# Patient Record
Sex: Male | Born: 1985
Health system: Southern US, Community
[De-identification: ages and names within clinical notes are randomized; demographics above are authoritative.]

## PROBLEM LIST (undated history)

## (undated) DIAGNOSIS — Z789 Other specified health status: Secondary | ICD-10-CM

## (undated) DIAGNOSIS — H7090 Unspecified mastoiditis, unspecified ear: Secondary | ICD-10-CM

## (undated) HISTORY — PX: NO PAST SURGERIES: SHX2092

## (undated) HISTORY — PX: LEG SURGERY: SHX1003

---

## 2018-12-09 DIAGNOSIS — H7090 Unspecified mastoiditis, unspecified ear: Secondary | ICD-10-CM

## 2018-12-09 HISTORY — DX: Unspecified mastoiditis, unspecified ear: H70.90

## 2018-12-15 ENCOUNTER — Other Ambulatory Visit: Payer: Self-pay

## 2018-12-15 ENCOUNTER — Encounter (HOSPITAL_BASED_OUTPATIENT_CLINIC_OR_DEPARTMENT_OTHER): Payer: Self-pay | Admitting: *Deleted

## 2018-12-15 DIAGNOSIS — H66011 Acute suppurative otitis media with spontaneous rupture of ear drum, right ear: Secondary | ICD-10-CM | POA: Insufficient documentation

## 2018-12-15 NOTE — ED Triage Notes (Signed)
URI sx x 1 week, now having pain in right ear. Headache also

## 2018-12-16 ENCOUNTER — Emergency Department (HOSPITAL_BASED_OUTPATIENT_CLINIC_OR_DEPARTMENT_OTHER)
Admission: EM | Admit: 2018-12-16 | Discharge: 2018-12-16 | Disposition: A | Discharge status: Home / Self Care | Payer: Self-pay | Attending: Emergency Medicine | Admitting: Emergency Medicine

## 2018-12-16 DIAGNOSIS — H66011 Acute suppurative otitis media with spontaneous rupture of ear drum, right ear: Secondary | ICD-10-CM

## 2018-12-16 MED ORDER — CIPROFLOXACIN-DEXAMETHASONE 0.3-0.1 % OT SUSP
4.0000 [drp] | Freq: Two times a day (BID) | OTIC | Status: DC
  Administered 2018-12-16: 4 [drp] via OTIC

## 2018-12-16 MED ORDER — CIPROFLOXACIN-DEXAMETHASONE 0.3-0.1 % OT SUSP
OTIC | Status: AC
  Filled 2018-12-16: qty 7.5

## 2018-12-16 MED ORDER — AMOXICILLIN 500 MG PO CAPS
1000.0000 mg | ORAL_CAPSULE | Freq: Once | ORAL | Status: AC
  Administered 2018-12-16: 1000 mg via ORAL
  Filled 2018-12-16: qty 2

## 2018-12-16 MED ORDER — AMOXICILLIN 500 MG PO CAPS: 1000.0000 mg | ORAL_CAPSULE | Freq: Three times a day (TID) | ORAL | 0 refills | Status: DC

## 2018-12-16 MED ORDER — HYDROCODONE-ACETAMINOPHEN 5-325 MG PO TABS
1.0000 | ORAL_TABLET | Freq: Once | ORAL | Status: AC
  Administered 2018-12-16: 1 via ORAL
  Filled 2018-12-16: qty 1

## 2018-12-16 MED ORDER — CIPROFLOXACIN-DEXAMETHASONE 0.3-0.1 % OT SUSP: 4.0000 [drp] | Freq: Two times a day (BID) | OTIC | 0 refills | Status: DC

## 2018-12-16 NOTE — Discharge Instructions (Addendum)
As we discussed it appears you have an ear infection that ruptured your eardrum.  You should take both antibiotics by mouth as well as the antibiotic drops.  Use Tylenol or ibuprofen as needed for fever and pain.  Follow-up with ear nose and throat doctor early next week.  Return to the ED if you develop worsening pain, fever, vomiting, any other concerns.

## 2018-12-16 NOTE — ED Notes (Signed)
Pts family member still standing outside of room. Provider was notified that she has asked many of the staff about pain medication. He acknowledged

## 2018-12-16 NOTE — ED Notes (Signed)
pts family member stepped out of room. RN stopped to see if family member needed anything. She stated that patients pain is getting worse. "I have known him for 4 years and have never seen him in so much pain". Primary RN notified

## 2018-12-16 NOTE — ED Notes (Signed)
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 Scripts given at [employer]. All questions answered to satisfaction. Ice pack given at dc. Pt and family escorted to check out counter.

## 2018-12-16 NOTE — ED Notes (Signed)
Pt took 800mg  around 630pm

## 2018-12-16 NOTE — ED Provider Notes (Signed)
[facility] EMERGENCY DEPARTMENT Provider Note   CSN: [id number] Arrival date & time: 12/15/18  2351    History   Chief Complaint Chief Complaint  Patient presents with  . Otalgia    HPI Marcus Finley is a 33 y.o. male.     Patient with 6 to 8 hours of severe right ear pain.  Denies trauma.  States he uses Q-tips on a regular basis but "does not go deep".  States he has had a URI for the past 1 week with cough, congestion, runny nose and sore throat.  No fever.  No history of ear problems.  There is been no bleeding or drainage from the ear.  He has not tried to take anything for it at home.  Denies any difficulty breathing or difficulty swallowing.  No chest pain or shortness of breath.  The history is provided by the patient.  Otalgia  Associated symptoms: congestion, cough, headaches, rhinorrhea and sore throat   Associated symptoms: no abdominal pain, no ear discharge, no neck pain, no rash and no vomiting     History reviewed. No pertinent past medical history.  There are no active problems to display for this patient.   Past Surgical History:  Procedure Laterality Date  . LEG SURGERY          Home Medications    Prior to Admission medications   Not on File    Family History No family history on file.  Social History Social History   Tobacco Use  . Smoking status: Never Smoker  . Smokeless tobacco: Never Used  Substance Use Topics  . Alcohol use: Yes  . Drug use: Never     Allergies   Patient has no known allergies.   Review of Systems Review of Systems  Constitutional: Negative for activity change and appetite change.  HENT: Positive for congestion, ear pain, rhinorrhea and sore throat. Negative for ear discharge.   Respiratory: Positive for cough. Negative for chest tightness.   Cardiovascular: Negative for chest pain.  Gastrointestinal: Negative for abdominal pain, nausea and vomiting.  Genitourinary: Negative for dysuria and  hematuria.  Musculoskeletal: Negative for arthralgias, back pain, myalgias and neck pain.  Skin: Negative for rash.  Neurological: Positive for headaches. Negative for dizziness, weakness and light-headedness.   all other systems are negative except as noted in the HPI and PMH.     Physical Exam Updated Vital Signs BP 126/72 (BP Location: Left Arm)   Pulse 89   Temp 98.1 F (36.7 C) (Oral)   Resp 16   Ht 5\' 11"  (1.803 m)   Wt 68 kg   SpO2 98%   BMI 20.92 kg/m   Physical Exam Vitals signs and nursing note reviewed.  Constitutional:      General: He is not in acute distress.    Appearance: He is well-developed.     Comments: Appears uncomfortable  HENT:     Head: Normocephalic and atraumatic.     Right Ear: Drainage present. No mastoid tenderness. Tympanic membrane is perforated, erythematous and bulging. Tympanic membrane has decreased mobility.     Left Ear: Tympanic membrane and ear canal normal.     Ears:     Comments: No tragus or mastoid pain bilaterally.  TM on left appears normal. Landmarks of TM on right cannot be visualized.  There is purulent drainage in the ear canal.  Suspect TM perforation.    Mouth/Throat:     Pharynx: No oropharyngeal exudate.  Eyes:  Conjunctiva/sclera: Conjunctivae normal.     Pupils: Pupils are equal, round, and reactive to light.  Neck:     Musculoskeletal: Normal range of motion and neck supple.     Comments: No meningismus. Cardiovascular:     Rate and Rhythm: Normal rate and regular rhythm.     Heart sounds: Normal heart sounds. No murmur.  Pulmonary:     Effort: Pulmonary effort is normal. No respiratory distress.     Breath sounds: Normal breath sounds.  Abdominal:     Palpations: Abdomen is soft.     Tenderness: There is no abdominal tenderness. There is no guarding or rebound.  Musculoskeletal: Normal range of motion.        General: No tenderness.  Skin:    General: Skin is warm.  Neurological:     Mental Status: He  is alert and oriented to person, place, and time.     Cranial Nerves: No cranial nerve deficit.     Motor: No abnormal muscle tone.     Coordination: Coordination normal.     Comments: No ataxia on finger to nose bilaterally. No pronator drift. 5/5 strength throughout. CN 2-12 intact.Equal grip strength. Sensation intact.   Psychiatric:        Behavior: Behavior normal.      ED Treatments / Results  Labs (all labs ordered are listed, but only abnormal results are displayed) Labs Reviewed - No data to display  EKG None  Radiology No results found.  Procedures Procedures (including critical care time)  Medications Ordered in ED Medications  HYDROcodone-acetaminophen (NORCO/VICODIN) 5-325 MG per tablet 1 tablet (has no administration in time range)  amoxicillin (AMOXIL) capsule 1,000 mg (has no administration in time range)     Initial Impression / Assessment and Plan / ED Course  I have reviewed the triage vital signs and the nursing notes.  Pertinent labs & imaging results that were available during my care of the patient were reviewed by me and considered in my medical decision making (see chart for details).       Patient with suspected purulent otitis media with TM perforation.  He is afebrile nontoxic-appearing.  No tragus or mastoid pain.  Will treat with PO as well as topical antibiotics. Pain control.  ENT followup early next week.  Return precautions discussed.   Final Clinical Impressions(s) / ED Diagnoses   Final diagnoses:  Acute suppurative otitis media of right ear with spontaneous rupture of tympanic membrane, recurrence not specified    ED Discharge Orders         Ordered    amoxicillin (AMOXIL) 500 MG capsule  3 times daily     12/16/18 0345    ciprofloxacin-dexamethasone (CIPRODEX) OTIC suspension  2 times daily     12/16/18 0345           Gabbie Marzo, Jeannett Senior, MD 12/16/18 [phone]

## 2018-12-22 ENCOUNTER — Inpatient Hospital Stay (HOSPITAL_BASED_OUTPATIENT_CLINIC_OR_DEPARTMENT_OTHER)
Admission: EM | Admit: 2018-12-22 | Discharge: 2018-12-25 | DRG: 153 | Disposition: A | Discharge status: Home / Self Care | Payer: Self-pay | Attending: Internal Medicine | Admitting: Internal Medicine

## 2018-12-22 ENCOUNTER — Emergency Department (HOSPITAL_BASED_OUTPATIENT_CLINIC_OR_DEPARTMENT_OTHER): Payer: Self-pay

## 2018-12-22 ENCOUNTER — Other Ambulatory Visit: Payer: Self-pay

## 2018-12-22 ENCOUNTER — Encounter (HOSPITAL_BASED_OUTPATIENT_CLINIC_OR_DEPARTMENT_OTHER): Payer: Self-pay | Admitting: Adult Health

## 2018-12-22 DIAGNOSIS — G510 Bell's palsy: Secondary | ICD-10-CM | POA: Diagnosis present

## 2018-12-22 DIAGNOSIS — H9211 Otorrhea, right ear: Secondary | ICD-10-CM | POA: Diagnosis present

## 2018-12-22 DIAGNOSIS — H66011 Acute suppurative otitis media with spontaneous rupture of ear drum, right ear: Principal | ICD-10-CM | POA: Diagnosis present

## 2018-12-22 DIAGNOSIS — H7091 Unspecified mastoiditis, right ear: Secondary | ICD-10-CM | POA: Diagnosis present

## 2018-12-22 DIAGNOSIS — H7090 Unspecified mastoiditis, unspecified ear: Secondary | ICD-10-CM | POA: Diagnosis present

## 2018-12-22 HISTORY — DX: Unspecified mastoiditis, unspecified ear: H70.90

## 2018-12-22 HISTORY — DX: Other specified health status: Z78.9

## 2018-12-22 LAB — CBC
HCT: 46.8 % (ref 39.0–52.0)
Hemoglobin: 16.7 g/dL (ref 13.0–17.0)
MCH: 33 pg (ref 26.0–34.0)
MCHC: 35.7 g/dL (ref 30.0–36.0)
MCV: 92.5 fL (ref 80.0–100.0)
Platelets: 322 10*3/uL (ref 150–400)
RBC: 5.06 MIL/uL (ref 4.22–5.81)
RDW: 11.6 % (ref 11.5–15.5)
WBC: 10 10*3/uL (ref 4.0–10.5)
nRBC: 0 % (ref 0.0–0.2)

## 2018-12-22 LAB — CBC WITH DIFFERENTIAL/PLATELET
Abs Immature Granulocytes: 0.05 10*3/uL (ref 0.00–0.07)
Basophils Absolute: 0.1 10*3/uL (ref 0.0–0.1)
Basophils Relative: 0 %
Eosinophils Absolute: 0.1 10*3/uL (ref 0.0–0.5)
Eosinophils Relative: 1 %
HCT: 49.8 % (ref 39.0–52.0)
Hemoglobin: 17.2 g/dL — ABNORMAL HIGH (ref 13.0–17.0)
Immature Granulocytes: 0 %
Lymphocytes Relative: 20 %
Lymphs Abs: 2.3 10*3/uL (ref 0.7–4.0)
MCH: 32.3 pg (ref 26.0–34.0)
MCHC: 34.5 g/dL (ref 30.0–36.0)
MCV: 93.4 fL (ref 80.0–100.0)
Monocytes Absolute: 0.7 10*3/uL (ref 0.1–1.0)
Monocytes Relative: 6 %
Neutro Abs: 8.1 10*3/uL — ABNORMAL HIGH (ref 1.7–7.7)
Neutrophils Relative %: 73 %
Platelets: 306 10*3/uL (ref 150–400)
RBC: 5.33 MIL/uL (ref 4.22–5.81)
RDW: 11.6 % (ref 11.5–15.5)
WBC: 11.2 10*3/uL — ABNORMAL HIGH (ref 4.0–10.5)
nRBC: 0 % (ref 0.0–0.2)

## 2018-12-22 LAB — CREATININE, SERUM
Creatinine, Ser: 0.92 mg/dL (ref 0.61–1.24)
GFR calc Af Amer: >60 mL/min (ref >60)
GFR calc non Af Amer: >60 mL/min (ref >60)

## 2018-12-22 LAB — BASIC METABOLIC PANEL
Anion gap: 7 (ref 5–15)
BUN: 13 mg/dL (ref 6–20)
CO2: 29 mmol/L (ref 22–32)
Calcium: 9.7 mg/dL (ref 8.9–10.3)
Chloride: 102 mmol/L (ref 98–111)
Creatinine, Ser: 1.02 mg/dL (ref 0.61–1.24)
GFR calc Af Amer: >60 mL/min (ref >60)
GFR calc non Af Amer: >60 mL/min (ref >60)
Glucose, Bld: 113 mg/dL — ABNORMAL HIGH (ref 70–99)
Potassium: 4.3 mmol/L (ref 3.5–5.1)
Sodium: 138 mmol/L (ref 135–145)

## 2018-12-22 MED ORDER — POTASSIUM CHLORIDE IN NACL 20-0.9 MEQ/L-% IV SOLN
INTRAVENOUS | Status: DC
  Administered 2018-12-22 – 2018-12-23 (×2): via INTRAVENOUS
  Filled 2018-12-22 (×2): qty 1000

## 2018-12-22 MED ORDER — POLYVINYL ALCOHOL 1.4 % OP SOLN
1.0000 [drp] | OPHTHALMIC | Status: DC
  Administered 2018-12-22 – 2018-12-25 (×34): 1 [drp] via OPHTHALMIC
  Filled 2018-12-22 (×2): qty 15

## 2018-12-22 MED ORDER — HYDROCODONE-ACETAMINOPHEN 5-325 MG PO TABS
1.0000 | ORAL_TABLET | Freq: Once | ORAL | Status: DC
  Filled 2018-12-22: qty 1

## 2018-12-22 MED ORDER — SODIUM CHLORIDE 0.9% FLUSH
3.0000 mL | INTRAVENOUS | Status: DC | PRN
  Administered 2018-12-23: 3 mL via INTRAVENOUS
  Filled 2018-12-22: qty 3

## 2018-12-22 MED ORDER — ACETAMINOPHEN 325 MG PO TABS: 650.0000 mg | ORAL_TABLET | Freq: Four times a day (QID) | ORAL | Status: DC | PRN

## 2018-12-22 MED ORDER — ARTIFICIAL TEARS OPHTHALMIC OINT
TOPICAL_OINTMENT | Freq: Every day | OPHTHALMIC | Status: DC
  Administered 2018-12-22 – 2018-12-24 (×3): via OPHTHALMIC
  Filled 2018-12-22: qty 3.5

## 2018-12-22 MED ORDER — ONDANSETRON HCL 4 MG/2ML IJ SOLN: 4.0000 mg | Freq: Four times a day (QID) | INTRAMUSCULAR | Status: DC | PRN

## 2018-12-22 MED ORDER — SODIUM CHLORIDE 0.9 % IV SOLN
INTRAVENOUS | Status: DC | PRN
  Administered 2018-12-22: 250 mL via INTRAVENOUS

## 2018-12-22 MED ORDER — HYDROCODONE-ACETAMINOPHEN 5-325 MG PO TABS
2.0000 | ORAL_TABLET | Freq: Once | ORAL | Status: AC
  Administered 2018-12-22: 2 via ORAL
  Filled 2018-12-22: qty 2

## 2018-12-22 MED ORDER — ONDANSETRON HCL 4 MG PO TABS: 4.0000 mg | ORAL_TABLET | Freq: Four times a day (QID) | ORAL | Status: DC | PRN

## 2018-12-22 MED ORDER — KETOROLAC TROMETHAMINE 30 MG/ML IJ SOLN
30.0000 mg | Freq: Four times a day (QID) | INTRAMUSCULAR | Status: DC | PRN
  Administered 2018-12-22 – 2018-12-23 (×2): 30 mg via INTRAVENOUS
  Filled 2018-12-22 (×2): qty 1

## 2018-12-22 MED ORDER — ENOXAPARIN SODIUM 40 MG/0.4ML ~~LOC~~ SOLN
40.0000 mg | SUBCUTANEOUS | Status: DC
  Administered 2018-12-22: 40 mg via SUBCUTANEOUS
  Filled 2018-12-22 (×3): qty 0.4

## 2018-12-22 MED ORDER — VANCOMYCIN HCL IN DEXTROSE 750-5 MG/150ML-% IV SOLN
750.0000 mg | Freq: Three times a day (TID) | INTRAVENOUS | Status: DC
  Administered 2018-12-22 – 2018-12-25 (×9): 750 mg via INTRAVENOUS
  Filled 2018-12-22 (×11): qty 150

## 2018-12-22 MED ORDER — VANCOMYCIN HCL 500 MG IV SOLR
INTRAVENOUS | Status: AC
  Filled 2018-12-22: qty 500

## 2018-12-22 MED ORDER — SODIUM CHLORIDE 0.9 % IV SOLN
1.0000 g | Freq: Once | INTRAVENOUS | Status: AC
  Administered 2018-12-22: 1 g via INTRAVENOUS
  Filled 2018-12-22: qty 10

## 2018-12-22 MED ORDER — ACETAMINOPHEN 650 MG RE SUPP: 650.0000 mg | Freq: Four times a day (QID) | RECTAL | Status: DC | PRN

## 2018-12-22 MED ORDER — SODIUM CHLORIDE 0.9 % IV SOLN
1.0000 g | INTRAVENOUS | Status: DC
  Administered 2018-12-23 – 2018-12-25 (×3): 1 g via INTRAVENOUS
  Filled 2018-12-22 (×3): qty 10

## 2018-12-22 MED ORDER — SODIUM CHLORIDE 0.9% FLUSH
3.0000 mL | Freq: Two times a day (BID) | INTRAVENOUS | Status: DC
  Administered 2018-12-23 – 2018-12-24 (×2): 3 mL via INTRAVENOUS

## 2018-12-22 MED ORDER — IOHEXOL 300 MG/ML  SOLN
100.0000 mL | Freq: Once | INTRAMUSCULAR | Status: AC | PRN
  Administered 2018-12-22: 80 mL via INTRAVENOUS

## 2018-12-22 MED ORDER — SODIUM CHLORIDE 0.9 % IV BOLUS
1000.0000 mL | Freq: Once | INTRAVENOUS | Status: AC
  Administered 2018-12-22: 1000 mL via INTRAVENOUS

## 2018-12-22 MED ORDER — OXYCODONE HCL 5 MG PO TABS
5.0000 mg | ORAL_TABLET | ORAL | Status: DC | PRN
  Administered 2018-12-22: 5 mg via ORAL
  Filled 2018-12-22 (×2): qty 1

## 2018-12-22 MED ORDER — SODIUM CHLORIDE 0.9 % IV SOLN: 250.0000 mL | INTRAVENOUS | Status: DC | PRN

## 2018-12-22 MED ORDER — CIPROFLOXACIN-DEXAMETHASONE 0.3-0.1 % OT SUSP
4.0000 [drp] | Freq: Two times a day (BID) | OTIC | Status: DC
  Administered 2018-12-22 – 2018-12-25 (×6): 4 [drp] via OTIC
  Filled 2018-12-22 (×2): qty 7.5

## 2018-12-22 MED ORDER — VANCOMYCIN HCL 500 MG IV SOLR
INTRAVENOUS | Status: AC
  Filled 2018-12-22: qty 1000

## 2018-12-22 MED ORDER — ZOLPIDEM TARTRATE 5 MG PO TABS: 5.0000 mg | ORAL_TABLET | Freq: Every evening | ORAL | Status: DC | PRN

## 2018-12-22 MED ORDER — VANCOMYCIN HCL 10 G IV SOLR
1500.0000 mg | Freq: Once | INTRAVENOUS | Status: AC
  Filled 2018-12-22: qty 1500

## 2018-12-22 NOTE — Consult Note (Signed)
Reason for Consult: Mastoiditis Referring Physician: Hospitalist  Marcus Finley is an 33 y.o. male.  HPI: 33 year old male developed URI symptoms about 10 days ago that later improved.  One week ago, his right jaw and then right ear began hurting.  The ear started draining.  He was seen in the ER and prescribed amoxicillin, Ciprodex drops, and pain medicine.  He was seen in our office three days later and was told to continue medicines and azithromycin was added for possible bullous myringitis.  He called back three days later with continued pain and needed more pain medicine which was given.  He was told to get a temporal bone CT that was done today through the ER.  He was transferred to [facility] for admission.  He has noticed right-sided facial weakness for the past two days.  Pain and ear drainage continue.  Past Medical History:  Diagnosis Date  . Medical history non-contributory     Past Surgical History:  Procedure Laterality Date  . LEG SURGERY    . NO PAST SURGERIES      History reviewed. No pertinent family history.  Social History:  reports that he has never smoked. He has never used smokeless tobacco. He reports current alcohol use. He reports that he does not use drugs.  Allergies: No Known Allergies  Medications: I have reviewed the patient's current medications.  Results for orders placed or performed during the hospital encounter of 12/22/18 (from the past 48 hour(s))  CBC with Differential     Status: Abnormal   Collection Time: 12/22/18 12:54 PM  Result Value Ref Range   WBC 11.2 (H) 4.0 - 10.5 K/uL   RBC 5.33 4.22 - 5.81 MIL/uL   Hemoglobin 17.2 (H) 13.0 - 17.0 g/dL   HCT 44.0 34.7 - 42.5 %   MCV 93.4 80.0 - 100.0 fL   MCH 32.3 26.0 - 34.0 pg   MCHC 34.5 30.0 - 36.0 g/dL   RDW 95.6 38.7 - 56.4 %   Platelets 306 150 - 400 K/uL   nRBC 0.0 0.0 - 0.2 %   Neutrophils Relative % 73 %   Neutro Abs 8.1 (H) 1.7 - 7.7 K/uL   Lymphocytes Relative 20 %   Lymphs Abs 2.3 0.7  - 4.0 K/uL   Monocytes Relative 6 %   Monocytes Absolute 0.7 0.1 - 1.0 K/uL   Eosinophils Relative 1 %   Eosinophils Absolute 0.1 0.0 - 0.5 K/uL   Basophils Relative 0 %   Basophils Absolute 0.1 0.0 - 0.1 K/uL   Immature Granulocytes 0 %   Abs Immature Granulocytes 0.05 0.00 - 0.07 K/uL    Comment: Performed at [facility], [address] [place], [place] [address]  Basic metabolic panel     Status: Abnormal   Collection Time: 12/22/18 12:54 PM  Result Value Ref Range   Sodium 138 135 - 145 mmol/L   Potassium 4.3 3.5 - 5.1 mmol/L   Chloride 102 98 - 111 mmol/L   CO2 29 22 - 32 mmol/L   Glucose, Bld 113 (H) 70 - 99 mg/dL   BUN 13 6 - 20 mg/dL   Creatinine, Ser 1.88 0.61 - 1.24 mg/dL   Calcium 9.7 8.9 - 41.6 mg/dL   GFR calc non Af Amer >60 >60 mL/min   GFR calc Af Amer >60 >60 mL/min   Anion gap 7 5 - 15    Comment: Performed at [facility], [address] [place], [place] [address]    Ct Temporal Bones W Contrast  Result Date: 12/22/2018 CLINICAL DATA:  Right ear pain.  Right facial weakness. EXAM: CT TEMPORAL BONES WITH CONTRAST TECHNIQUE: Axial and coronal plane CT imaging of the petrous temporal bones was performed with thin-collimation image reconstruction after intravenous contrast administration. Multiplanar CT image reconstructions were also generated. CONTRAST:  16mL OMNIPAQUE IOHEXOL 300 MG/ML  SOLN COMPARISON:  None. FINDINGS: Left temporal bone: External auditory canal is widely patent. Tympanic membrane is normal. Middle ear is clear. Mastoid air cells are clear. Inner ear structures appear normal. Right temporal bone: Complete opacification of the middle ear. No ossicular chain destruction identified. Near complete opacification throughout the mastoid air cells and the attic. No evidence of bone destruction or coalescence. Inner ear structures appear normal. No evidence of adjacent soft tissue abscess or of intracranial complication. Transverse and  sigmoid sinuses show flow. Temporomandibular joints appear grossly unremarkable. IMPRESSION: Complete opacification of the middle ear on the right. Near complete opacification of the mastoid air cells and attic on the right. No evidence of bone destruction or coalescence. No evidence of adjacent soft tissue abscess or intracranial complication. Findings are consistent with otitis media and mastoiditis. Electronically Signed   By: Paulina Fusi M.D.   On: 12/22/2018 14:15    Review of Systems  HENT: Positive for ear discharge and ear pain.   Neurological: Positive for focal weakness.  All other systems reviewed and are negative.  Blood pressure 129/79, pulse (!) 56, temperature 98 F (36.7 C), temperature source Oral, resp. rate 18, height 5\' 11"  (1.803 m), weight 68 kg, SpO2 100 %. Physical Exam  Constitutional: He is oriented to person, place, and time. He appears well-developed and well-nourished. No distress.  HENT:  Head: Normocephalic and atraumatic.  Right Ear: External ear normal.  Left Ear: External ear normal.  Nose: Nose normal.  Mouth/Throat: Oropharynx is clear and moist.  Right otorrhea, able to visualize posterior perforation with associated granulation.  Eyes: Pupils are equal, round, and reactive to light. Conjunctivae and EOM are normal.  Neck: Normal range of motion. Neck supple.  Respiratory: Effort normal.  Neurological: He is alert and oriented to person, place, and time. A cranial nerve deficit (Right facial weakness, able to close eye but does not blink naturally, corner of mouth weak.) is present.  Skin: Skin is warm and dry.  Psychiatric: He has a normal mood and affect. His behavior is normal. Judgment and thought content normal.    Assessment/Plan: Right acute otomastoiditis with perforation, otorrhea, and facial weakness  I personally reviewed his temporal bone CT demonstrating opacified right middle ear and mastoid.  He has a perforation of the tympanic  membrane on exam that is allowing drainage of the infection.  Thus, a tube is not needed.  I collected a culture from the right ear.  I agreed with broad spectrum IV antibiotics and continued Ciprodex drops.  Will add right eye moisturizing care.  Will follow closely.  Anticipate a few days of inpatient management.  Christia Reading 12/22/2018, 6:00 PM

## 2018-12-22 NOTE — ED Triage Notes (Addendum)
PResents with right ear pain, he has been seen by ENT and placed on antibiotics, he was told to come here due to stiill having severe pain with hydrocodone ans the antibiotic. He endorses pain with opening his mouth as well.  -taking AMox  He appears to have a slight facial droop to right side and complains of numbness in the right side, he has a difficult time closing his right eye as well.

## 2018-12-22 NOTE — ED Provider Notes (Signed)
[facility] EMERGENCY DEPARTMENT Provider Note   CSN: [id number] Arrival date & time: 12/22/18  1117    History   Chief Complaint Chief Complaint  Patient presents with  . Otalgia    HPI Marcus Finley is a 33 y.o. male who presents with a 10-day history of right ear pain and drainage.  Patient was seen on 12/16/2018 in the ED and started on amoxicillin and Ciprodex.  He was sent for follow-up to the ENT and switch to azithromycin.  He has had continued pain and now having weakness of the right side of his face and pain when he opens his jaw below his ear.  He denies any fevers.  He spoke with the ENT who advised him to come here for CT scan.     HPI  History reviewed. No pertinent past medical history.  Patient Active Problem List   Diagnosis Date Noted  . Mastoiditis 12/22/2018    Past Surgical History:  Procedure Laterality Date  . LEG SURGERY          Home Medications    Prior to Admission medications   Medication Sig Start Date End Date Taking? Authorizing Provider  amoxicillin (AMOXIL) 500 MG capsule Take 2 capsules (1,000 mg total) by mouth 3 (three) times daily. 12/16/18   Rancour, Jeannett Senior, MD  ciprofloxacin-dexamethasone (CIPRODEX) OTIC suspension Place 4 drops into the right ear 2 (two) times daily for 7 days. 12/16/18 12/23/18  Glynn Octave, MD    Family History History reviewed. No pertinent family history.  Social History Social History   Tobacco Use  . Smoking status: Never Smoker  . Smokeless tobacco: Never Used  Substance Use Topics  . Alcohol use: Yes  . Drug use: Never     Allergies   Patient has no known allergies.   Review of Systems Review of Systems  Constitutional: Negative for chills and fever.  HENT: Positive for ear pain. Negative for facial swelling and sore throat.   Respiratory: Negative for shortness of breath.   Cardiovascular: Negative for chest pain.  Gastrointestinal: Negative for abdominal pain, nausea and  vomiting.  Genitourinary: Negative for dysuria.  Musculoskeletal: Negative for back pain.  Skin: Negative for rash and wound.  Neurological: Positive for facial asymmetry. Negative for headaches.  Psychiatric/Behavioral: The patient is not nervous/anxious.      Physical Exam Updated Vital Signs BP (!) 97/50 (BP Location: Left Arm)   Pulse (!) 53   Temp 98 F (36.7 C) (Oral)   Resp 18   Ht  (1.803 m)   Wt 68 kg   SpO2 97%   BMI 20.91 kg/m   Physical Exam Vitals signs and nursing note reviewed.  Constitutional:      General: He is not in acute distress.    Appearance: He is well-developed. He is not diaphoretic.  HENT:     Head: Normocephalic and atraumatic.     Right Ear: Decreased hearing noted. Drainage present. A middle ear effusion is present. There is mastoid tenderness. Tympanic membrane is injected, perforated and erythematous.     Left Ear: Tympanic membrane normal.     Mouth/Throat:     Pharynx: No oropharyngeal exudate.  Eyes:     General: No scleral icterus.       Right eye: No discharge.        Left eye: No discharge.     Conjunctiva/sclera: Conjunctivae normal.     Pupils: Pupils are equal, round, and reactive to light.  Neck:  Musculoskeletal: Normal range of motion and neck supple.     Thyroid: No thyromegaly.  Cardiovascular:     Rate and Rhythm: Normal rate and regular rhythm.     Heart sounds: Normal heart sounds. No murmur. No friction rub. No gallop.   Pulmonary:     Effort: Pulmonary effort is normal. No respiratory distress.     Breath sounds: Normal breath sounds. No stridor. No wheezing or rales.  Abdominal:     General: Bowel sounds are normal. There is no distension.     Palpations: Abdomen is soft.     Tenderness: There is no abdominal tenderness. There is no guarding or rebound.  Lymphadenopathy:     Cervical: No cervical adenopathy.  Skin:    General: Skin is warm and dry.     Coloration: Skin is not pale.     Findings: No  rash.  Neurological:     Mental Status: He is alert.     Coordination: Coordination normal.      ED Treatments / Results  Labs (all labs ordered are listed, but only abnormal results are displayed) Labs Reviewed  CBC WITH DIFFERENTIAL/PLATELET - Abnormal; Notable for the following components:      Result Value   WBC 11.2 (*)    Hemoglobin 17.2 (*)    Neutro Abs 8.1 (*)    All other components within normal limits  BASIC METABOLIC PANEL - Abnormal; Notable for the following components:   Glucose, Bld 113 (*)    All other components within normal limits    EKG None  Radiology Ct Temporal Bones W Contrast  Result Date: 12/22/2018 CLINICAL DATA:  Right ear pain.  Right facial weakness. EXAM: CT TEMPORAL BONES WITH CONTRAST TECHNIQUE: Axial and coronal plane CT imaging of the petrous temporal bones was performed with thin-collimation image reconstruction after intravenous contrast administration. Multiplanar CT image reconstructions were also generated. CONTRAST:  61mL OMNIPAQUE IOHEXOL 300 MG/ML  SOLN COMPARISON:  None. FINDINGS: Left temporal bone: External auditory canal is widely patent. Tympanic membrane is normal. Middle ear is clear. Mastoid air cells are clear. Inner ear structures appear normal. Right temporal bone: Complete opacification of the middle ear. No ossicular chain destruction identified. Near complete opacification throughout the mastoid air cells and the attic. No evidence of bone destruction or coalescence. Inner ear structures appear normal. No evidence of adjacent soft tissue abscess or of intracranial complication. Transverse and sigmoid sinuses show flow. Temporomandibular joints appear grossly unremarkable. IMPRESSION: Complete opacification of the middle ear on the right. Near complete opacification of the mastoid air cells and attic on the right. No evidence of bone destruction or coalescence. No evidence of adjacent soft tissue abscess or intracranial  complication. Findings are consistent with otitis media and mastoiditis. Electronically Signed   By: Paulina Fusi M.D.   On: 12/22/2018 14:15    Procedures Procedures (including critical care time)  Medications Ordered in ED Medications  vancomycin (VANCOCIN) 1,500 mg in sodium chloride 0.9 % 500 mL IVPB (has no administration in time range)    Followed by  vancomycin (VANCOCIN) IVPB 750 mg/150 ml premix (has no administration in time range)  0.9 %  sodium chloride infusion (250 mLs Intravenous New Bag/Given 12/22/18 1508)  sodium chloride 0.9 % bolus 1,000 mL (has no administration in time range)  vancomycin (VANCOCIN) 500 MG powder (has no administration in time range)  vancomycin (VANCOCIN) 500 MG powder (has no administration in time range)  iohexol (OMNIPAQUE) 300 MG/ML solution 100  mL (80 mLs Intravenous Contrast Given 12/22/18 1356)  cefTRIAXone (ROCEPHIN) 1 g in sodium chloride 0.9 % 100 mL IVPB (1 g Intravenous New Bag/Given 12/22/18 1510)     Initial Impression / Assessment and Plan / ED Course  I have reviewed the triage vital signs and the nursing notes.  Pertinent labs & imaging results that were available during my care of the patient were reviewed by me and considered in my medical decision making (see chart for details).        Patient with mastoiditis and otitis media refractory to oral and topical antibiotics.  CT shows complete opacification of the middle ear on the right and near complete opacification of the mastoid air cells in the attic.  Findings consistent with otitis media and mastoiditis.  I discussed patient case with ENT, Dr. Jenne Pane, who advised admission for IV antibiotics.  Vancomycin and Rocephin initiated in the ED.  Patient will be admitted at [facility].  I discussed patient case with Dr. Willette Pa, with [facility], who accepts patient for admission.  I appreciate the above consultants for their assistance with the patient.  Patient also evaluated by my attending, Dr.  Denton Lank, who guided the patient's management and agrees with plan.  Final Clinical Impressions(s) / ED Diagnoses   Final diagnoses:  Mastoiditis of right side  Acute suppurative otitis media of right ear with spontaneous rupture of tympanic membrane, recurrence not specified    ED Discharge Orders    None       Emi Holes, PA-C 12/22/18 1546    Cathren Laine, MD 12/22/18 1555

## 2018-12-22 NOTE — ED Notes (Signed)
Report given to Stasia Cavalier, receiving nurse at [facility]

## 2018-12-22 NOTE — Progress Notes (Signed)
Pharmacy Antibiotic Note  Marcus Finley is a 33 y.o. male admitted on 12/22/2018 with mastoiditis and otitis medica on CT scan, non responsive to amoxicillin as outpatient.  Pharmacy has been consulted for Vancomycin dosing.  WBC slightly elevated at 11.2, Afebrile. SCr is wnl. VSS.  CTX 1g IV x1 in MCHPED.   Plan: Vancomycin 1500mg  IV x1 then 750mg  IV every 8 hours.  Goal AUC 400-550. Expected AUC: 516 SCr used: 1.02 Follow up for continuation of GN coverage with source likely ear infection.  Monitor renal function, culture results, and clinical status.    Height: 5\' 11"  (180.3 cm) Weight: 149 lb 14.6 oz (68 kg) IBW/kg (Calculated) : 75.3  Temp (24hrs), Avg:98 F (36.7 C), Min:98 F (36.7 C), Max:98 F (36.7 C)  Recent Labs  Lab 12/22/18 1254  WBC 11.2*  CREATININE 1.02    Estimated Creatinine Clearance: 101.9 mL/min (by C-G formula based on SCr of 1.02 mg/dL).    No Known Allergies  Antimicrobials this admission: Vancomycin 3/14 >>  Dose adjustments this admission:   Microbiology results: none  Thank you for allowing pharmacy to be a part of this patient's care.  Link Snuffer, PharmD, BCPS, BCCCP Clinical Pharmacist Please refer to [facility] for [facility] numbers 12/22/2018 2:47 PM

## 2018-12-22 NOTE — H&P (Signed)
History and Physical    Marcus Finley XTG:[id number] DOB: 1986/03/01 DOA: 12/22/2018  PCP: Patient, No Pcp Per  Patient coming from: Marcus Finley  I have personally briefly reviewed patient's old medical records in [place]  Chief Complaint: Persistent ear pain for 10 days  HPI: Marcus Finley is a 33 y.o. male with no prior medical history who presents the emergency department at [facility] with a complaint of a 10-day history of right ear pain and drainage.  He was seen on 12/16/2018 in the emergency department started on amoxicillin and Ciprodex.  He followed up with ENT and was switched to azithromycin for concerns of bullous Titus media.  Bite being compliant with medication he has had continued pain and is now having weakness of the right side of his face and pain when he opens his jaw below his ears.  He is experiencing no fevers, no cough, no shortness of breath, no nausea, no vomiting.  He spoke with Dr. Redmond Baseman from ENT who advised the patient to come to the emergency department for CT scan.  ED Course: CT showed complete opacification of the middle ear on the right and near complete opacification of the mastoid air cells in the attic.  Findings were consistent with otitis media and mastoiditis.  Discussed the case with Dr. Redmond Baseman who recommended admission for IV antibiotics.  Given Rocephin and vancomycin in the emergency department  Review of Systems: As per HPI otherwise all other systems reviewed and  negative.   Past Medical History:  Diagnosis Date  . Medical history non-contributory   Prior medical history  Past Surgical History:  Procedure Laterality Date  . LEG SURGERY    . NO PAST SURGERIES      Social History   Social History Narrative  . Not on file     reports that he has never smoked. He has never used smokeless tobacco. He reports current alcohol use. He reports that he does not use drugs.  No Known Allergies  History reviewed. No  pertinent family history. No family history of ear trouble  Prior to Admission medications   Medication Sig Start Date End Date Taking? Authorizing Provider  amoxicillin (AMOXIL) 500 MG capsule Take 2 capsules (1,000 mg total) by mouth 3 (three) times daily. 12/16/18   Rancour, Annie Main, MD  ciprofloxacin-dexamethasone (CIPRODEX) OTIC suspension Place 4 drops into the right ear 2 (two) times daily for 7 days. 12/16/18 12/23/18  Ezequiel Essex, MD    Physical Exam:  Constitutional: NAD, calm, comfortable Vitals:   12/22/18 1512 12/22/18 1628 12/22/18 1722 12/22/18 1729  BP: (!) 97/50 123/72 129/79   Pulse: (!) 53 (!) 57 (!) 56   Resp:  18    Temp:    98.8 F (37.1 C)  TempSrc:    Oral  SpO2: 97%  100%   Weight:    68.5 kg  Height:    5' 10.5" (1.791 m)   Eyes: PERRL, lids and conjunctivae normal ENMT: Mucous membranes are moist. Posterior pharynx clear of any exudate or lesions.Normal dentition.  Neck: normal, supple, no masses, no thyromegaly Respiratory: clear to auscultation bilaterally, no wheezing, no crackles. Normal respiratory effort. No accessory muscle use.  Cardiovascular: Regular rate and rhythm, no murmurs / rubs / gallops. No extremity edema. 2+ pedal pulses. No carotid bruits.  Abdomen: no tenderness, no masses palpated. No hepatosplenomegaly. Bowel sounds positive.  Musculoskeletal: no clubbing / cyanosis. No joint deformity upper and lower extremities. Good  ROM, no contractures. Normal muscle tone.  Skin: no rashes, lesions, ulcers. No induration Neurologic: CN 2-12 grossly intact. Sensation intact, DTR normal. Strength 5/5 in all 4.  Psychiatric: Normal judgment and insight. Alert and oriented x 3. Normal mood.    Labs on Admission: I have personally reviewed following labs and imaging studies  CBC: Recent Labs  Lab 12/22/18 1254  WBC 11.2*  NEUTROABS 8.1*  HGB 17.2*  HCT 49.8  MCV 93.4  PLT 453   Basic Metabolic Panel: Recent Labs  Lab 12/22/18 1254   NA 138  K 4.3  CL 102  CO2 29  GLUCOSE 113*  BUN 13  CREATININE 1.02  CALCIUM 9.7   GFR: Estimated Creatinine Clearance: 102.6 mL/min (by C-G formula based on SCr of 1.02 mg/dL). Radiological Exams on Admission: Ct Temporal Bones W Contrast  Result Date: 12/22/2018 CLINICAL DATA:  Right ear pain.  Right facial weakness. EXAM: CT TEMPORAL BONES WITH CONTRAST TECHNIQUE: Axial and coronal plane CT imaging of the petrous temporal bones was performed with thin-collimation image reconstruction after intravenous contrast administration. Multiplanar CT image reconstructions were also generated. CONTRAST:  56m OMNIPAQUE IOHEXOL 300 MG/ML  SOLN COMPARISON:  None. FINDINGS: Left temporal bone: External auditory canal is widely patent. Tympanic membrane is normal. Middle ear is clear. Mastoid air cells are clear. Inner ear structures appear normal. Right temporal bone: Complete opacification of the middle ear. No ossicular chain destruction identified. Near complete opacification throughout the mastoid air cells and the attic. No evidence of bone destruction or coalescence. Inner ear structures appear normal. No evidence of adjacent soft tissue abscess or of intracranial complication. Transverse and sigmoid sinuses show flow. Temporomandibular joints appear grossly unremarkable. IMPRESSION: Complete opacification of the middle ear on the right. Near complete opacification of the mastoid air cells and attic on the right. No evidence of bone destruction or coalescence. No evidence of adjacent soft tissue abscess or intracranial complication. Findings are consistent with otitis media and mastoiditis. Electronically Signed   By: MNelson ChimesM.D.   On: 12/22/2018 14:15      Assessment/Plan Active Problems:   Mastoiditis    1.  Mastoiditis: Patient admitted into the hospital for treatment with IV vancomycin and ceftriaxone.  I personally met with Dr. BRedmond Basemanand discussed with him the patient's case.  He will  evaluate the patient to see if he needs ear tubes placed tonight.  He is currently n.p.o.  If patient does not need to go to the operating room a regular diet may be started.  2.  Pain control: Oral medications ordered.  DVT prophylaxis: Lovenox Code Status: Full code Family Communication: No family present at the time of admission.  Patient retains capacity. Disposition Plan: Likely home in 3 to 4 days Consults called: Dr. BRedmond Basemanfrom [facility] Admission status: Inpatient   TLady DeutscherMD [facility]ford[facility] Pager [phone] How to contact the TSouthwest Surgical SuitesAttending or Consulting provider [place] covering provider during after hours [place] for this patient?  1. Check the care team in [facility]and look for a) attending/consulting TRH provider listed and b) the TP[facility] Surgery Centerteam listed 2. Log into [email or web address] and use [facility] universal password to access. If you do not have the password, please contact the hospital operator. 3. Locate the TWestside Gi Centerprovider you are looking for under [facility] and page to a number that you can be directly reached. 4. If you still have difficulty reaching  the provider, please page the [facility] (Director on Call) for the Hospitalists listed on amion for assistance.  If 7PM-7AM, please contact night-coverage [email or web address] Password [facility]  12/22/2018, 6:08 PM

## 2018-12-22 NOTE — ED Notes (Signed)
Vancomycin 1500 in 500NS sent with [facility]

## 2018-12-23 DIAGNOSIS — H66011 Acute suppurative otitis media with spontaneous rupture of ear drum, right ear: Principal | ICD-10-CM

## 2018-12-23 LAB — BASIC METABOLIC PANEL
Anion gap: 7 (ref 5–15)
BUN: 10 mg/dL (ref 6–20)
CO2: 27 mmol/L (ref 22–32)
Calcium: 8.8 mg/dL — ABNORMAL LOW (ref 8.9–10.3)
Chloride: 105 mmol/L (ref 98–111)
Creatinine, Ser: 0.9 mg/dL (ref 0.61–1.24)
GFR calc Af Amer: >60 mL/min (ref >60)
GFR calc non Af Amer: >60 mL/min (ref >60)
Glucose, Bld: 72 mg/dL (ref 70–99)
POTASSIUM: 3.9 mmol/L (ref 3.5–5.1)
Sodium: 139 mmol/L (ref 135–145)

## 2018-12-23 LAB — CBC
HCT: 43.5 % (ref 39.0–52.0)
Hemoglobin: 15 g/dL (ref 13.0–17.0)
MCH: 32.1 pg (ref 26.0–34.0)
MCHC: 34.5 g/dL (ref 30.0–36.0)
MCV: 92.9 fL (ref 80.0–100.0)
Platelets: 313 10*3/uL (ref 150–400)
RBC: 4.68 MIL/uL (ref 4.22–5.81)
RDW: 11.5 % (ref 11.5–15.5)
WBC: 10.1 10*3/uL (ref 4.0–10.5)
nRBC: 0 % (ref 0.0–0.2)

## 2018-12-23 LAB — HIV ANTIBODY (ROUTINE TESTING W REFLEX): HIV Screen 4th Generation wRfx: NONREACTIVE

## 2018-12-23 MED ORDER — HYDROCODONE-ACETAMINOPHEN 5-325 MG PO TABS
1.0000 | ORAL_TABLET | Freq: Four times a day (QID) | ORAL | Status: DC | PRN
  Administered 2018-12-23 – 2018-12-25 (×8): 2 via ORAL
  Filled 2018-12-23 (×8): qty 2
  Filled 2018-12-23: qty 1

## 2018-12-23 MED ORDER — SODIUM CHLORIDE 0.9 % IV SOLN
INTRAVENOUS | Status: DC
  Administered 2018-12-23: 14:00:00 via INTRAVENOUS

## 2018-12-23 MED ORDER — VALACYCLOVIR HCL 500 MG PO TABS
1000.0000 mg | ORAL_TABLET | Freq: Three times a day (TID) | ORAL | Status: DC
  Administered 2018-12-23 – 2018-12-24 (×4): 1000 mg via ORAL
  Filled 2018-12-23 (×7): qty 2

## 2018-12-23 NOTE — Progress Notes (Signed)
PROGRESS NOTE    Marcus Finley  DXI:[id number] DOB: Jul 09, 1986 DOA: 12/22/2018 PCP: Patient, No Pcp Per   Brief Narrative: 33 year old with no significant past medical history who presents to the emergency department complaining of worsening right ear pain and drainage.  He was seen on 12/16/2018 in the ED and he was a started on amoxicillin and Ciprodex.  He follow-up with an ENT and was switched to azithromycin.  He presents with worsening pain, right side facial weakness.  He denies fever no cough.  He was evaluated by Dr. Jenne Pane ENT and recommended admission for IV antibiotics.   Assessment & Plan:   Active Problems:   Mastoiditis  1-Mastoiditis, right otitis media; -Failed oral antibiotics. -He was evaluated by ENT who recommend IV antibiotics. -Continue IV vancomycin and ceftriaxone. -Continue with Toradol as needed. -Pain management. -Patient is still complaining of significant right ear pain. -Follow  Culture, from ear   2-Right facial paralysis, mild. Bells' palsy  To be cautious I will start antiviral. Will defer oral steroids to ENT.   Estimated body mass index is 21.36 kg/m as calculated from the following:   Height as of this encounter: 5' 10.5" (1.791 m).   Weight as of this encounter: 68.5 kg.   DVT prophylaxis: Lovenox Code Status: Full code Family Communication: Friend at bedside Disposition Plan: Remain in the hospital for IV antibiotics  Consultants:   ENT, Dr. Jenne Pane   Procedures:   None   Antimicrobials:   Vancomycin and ceftriaxone   Subjective: He is just waking up, he reported the pain is started to get progressive force of the days goes by. Continue to have drainage through his ER He reports prior history of blisters sore many years ago Objective: Vitals:   12/22/18 1729 12/22/18 2129 12/23/18 0106 12/23/18 0518  BP:  134/80 129/73 119/60  Pulse:  (!) 57 60 (!) 57  Resp:  18 18 18   Temp: 98.8 F (37.1 C) 98.7 F (37.1 C) 98.5 F  (36.9 C) 98.2 F (36.8 C)  TempSrc: Oral Oral Oral Oral  SpO2:  99% 97% 99%  Weight: 68.5 kg     Height: 5' 10.5" (1.791 m)       Intake/Output Summary (Last 24 hours) at 12/23/2018 0938 Last data filed at 12/23/2018 0936 Gross per 24 hour  Intake 336.78 ml  Output 300 ml  Net 36.78 ml   Filed Weights   12/22/18 1128 12/22/18 1729  Weight: 68 kg 68.5 kg    Examination:  General exam: Appears calm and comfortable  ENT; normal dentition, right ear with some small amount of drainage fluid yellowish.  He is very tender around the ear and neck area Respiratory system: Clear to auscultation. Respiratory effort normal. Cardiovascular system: S1 & S2 heard, RRR. No JVD, murmurs, rubs, gallops or clicks. No pedal edema. Gastrointestinal system: Abdomen is nondistended, soft and nontender. No organomegaly or masses felt. Normal bowel sounds heard. Central nervous system: Alert and oriented.  Mild right-sided facial weakness Extremities: Symmetric 5 x 5 power. Skin: No rashes, lesions or ulcers Psychiatry: Judgement and insight appear normal. Mood & affect appropriate.     Data Reviewed: I have personally reviewed following labs and imaging studies  CBC: Recent Labs  Lab 12/22/18 1254 12/22/18 1815 12/23/18 0336  WBC 11.2* 10.0 10.1  NEUTROABS 8.1*  --   --   HGB 17.2* 16.7 15.0  HCT 49.8 46.8 43.5  MCV 93.4 92.5 92.9  PLT 306 322 313   Basic  Metabolic Panel: Recent Labs  Lab 12/22/18 1254 12/22/18 1815 12/23/18 0336  NA 138  --  139  K 4.3  --  3.9  CL 102  --  105  CO2 29  --  27  GLUCOSE 113*  --  72  BUN 13  --  10  CREATININE 1.02 0.92 0.90  CALCIUM 9.7  --  8.8*   GFR: Estimated Creatinine Clearance: 116.3 mL/min (by C-G formula based on SCr of 0.9 mg/dL). Liver Function Tests: No results for input(s): AST, ALT, ALKPHOS, BILITOT, PROT, ALBUMIN in the last 168 hours. No results for input(s): LIPASE, AMYLASE in the last 168 hours. No results for input(s):  AMMONIA in the last 168 hours. Coagulation Profile: No results for input(s): INR, PROTIME in the last 168 hours. Cardiac Enzymes: No results for input(s): CKTOTAL, CKMB, CKMBINDEX, TROPONINI in the last 168 hours. BNP (last 3 results) No results for input(s): PROBNP in the last 8760 hours. HbA1C: No results for input(s): HGBA1C in the last 72 hours. CBG: No results for input(s): GLUCAP in the last 168 hours. Lipid Profile: No results for input(s): CHOL, HDL, LDLCALC, TRIG, CHOLHDL, LDLDIRECT in the last 72 hours. Thyroid Function Tests: No results for input(s): TSH, T4TOTAL, FREET4, T3FREE, THYROIDAB in the last 72 hours. Anemia Panel: No results for input(s): VITAMINB12, FOLATE, FERRITIN, TIBC, IRON, RETICCTPCT in the last 72 hours. Sepsis Labs: No results for input(s): PROCALCITON, LATICACIDVEN in the last 168 hours.  No results found for this or any previous visit (from the past 240 hour(s)).       Radiology Studies: Ct Temporal Bones W Contrast  Result Date: 12/22/2018 CLINICAL DATA:  Right ear pain.  Right facial weakness. EXAM: CT TEMPORAL BONES WITH CONTRAST TECHNIQUE: Axial and coronal plane CT imaging of the petrous temporal bones was performed with thin-collimation image reconstruction after intravenous contrast administration. Multiplanar CT image reconstructions were also generated. CONTRAST:  80mL OMNIPAQUE IOHEXOL 300 MG/ML  SOLN COMPARISON:  None. FINDINGS: Left temporal bone: External auditory canal is widely patent. Tympanic membrane is normal. Middle ear is clear. Mastoid air cells are clear. Inner ear structures appear normal. Right temporal bone: Complete opacification of the middle ear. No ossicular chain destruction identified. Near complete opacification throughout the mastoid air cells and the attic. No evidence of bone destruction or coalescence. Inner ear structures appear normal. No evidence of adjacent soft tissue abscess or of intracranial complication.  Transverse and sigmoid sinuses show flow. Temporomandibular joints appear grossly unremarkable. IMPRESSION: Complete opacification of the middle ear on the right. Near complete opacification of the mastoid air cells and attic on the right. No evidence of bone destruction or coalescence. No evidence of adjacent soft tissue abscess or intracranial complication. Findings are consistent with otitis media and mastoiditis. Electronically Signed   By: Paulina Fusi M.D.   On: 12/22/2018 14:15        Scheduled Meds:  artificial tears   Right Eye QHS   ciprofloxacin-dexamethasone  4 drop Right EAR BID   enoxaparin (LOVENOX) injection  40 mg Subcutaneous Q24H   polyvinyl alcohol  1 drop Right Eye Q1H while awake   sodium chloride flush  3 mL Intravenous Q12H   valACYclovir  1,000 mg Oral TID   Continuous Infusions:  sodium chloride Stopped (12/22/18 1629)   sodium chloride     cefTRIAXone (ROCEPHIN)  IV     vancomycin 750 mg (12/23/18 0650)     LOS: 1 day    Time spent: 35  minutes.     Alba Cory, MD [facility] Pager [phone]  If 7PM-7AM, please contact night-coverage [email or web address] Password [facility] 12/23/2018, 9:38 AM

## 2018-12-23 NOTE — Progress Notes (Addendum)
   Subjective:    Patient ID: Marcus Finley, male    DOB: 1986-07-08, 33 y.o.   MRN: [id number]  HPI Feels the same today with pain requiring narcotic medicine, right ear drainage, and right facial weakness.  Review of Systems     Objective:   Physical Exam AF VSS Alert, NAD Right ear with purulent otorrhea Right face with weakness of all divisions unchanged     Assessment & Plan:  Right acute otomastoiditis with TM perforation, otorrhea, and right facial weakness  Continue broad spectrum IV antibiotics, Ciprodex drops, right eye moisturizing care, and pain control.  Culture pending.  Condition not suggestive of a viral process.

## 2018-12-24 ENCOUNTER — Encounter (HOSPITAL_COMMUNITY): Payer: Self-pay | Admitting: General Practice

## 2018-12-24 LAB — BASIC METABOLIC PANEL
ANION GAP: 9 (ref 5–15)
BUN: 10 mg/dL (ref 6–20)
CALCIUM: 9.2 mg/dL (ref 8.9–10.3)
CO2: 22 mmol/L (ref 22–32)
CREATININE: 0.97 mg/dL (ref 0.61–1.24)
Chloride: 105 mmol/L (ref 98–111)
GFR calc non Af Amer: >60 mL/min (ref >60)
Glucose, Bld: 77 mg/dL (ref 70–99)
Potassium: 4.4 mmol/L (ref 3.5–5.1)
Sodium: 136 mmol/L (ref 135–145)

## 2018-12-24 MED ORDER — VALACYCLOVIR HCL 500 MG PO TABS
1000.0000 mg | ORAL_TABLET | Freq: Three times a day (TID) | ORAL | Status: DC
  Administered 2018-12-24 – 2018-12-25 (×3): 1000 mg via ORAL
  Filled 2018-12-24 (×5): qty 2

## 2018-12-24 NOTE — Progress Notes (Signed)
   Subjective:    Patient ID: Marcus Finley, male    DOB: May 25, 1986, 33 y.o.   MRN: [id number]  HPI He reports symptoms are fairly similar today with continued pain and ear drainage.  He may have needed a bit less pain medicine, however.  Review of Systems     Objective:   Physical Exam AF VSS Alert, NAD Right otorrhea Right facial weakness unchanged     Assessment & Plan:  Right acute otomastoiditis with TM perforation, otorrhea, and facial paresis  Continue broad spectrum antibiotics, drops, eye care, and pain control.  Culture pending.  He will likely notice improvement in symptoms in the next day or 2.  If not, we may consider repeat CT imaging.

## 2018-12-24 NOTE — Progress Notes (Signed)
PROGRESS NOTE    Marcus Finley  DGL:[id number] DOB: 07-18-1986 DOA: 12/22/2018 PCP: Patient, No Pcp Per   Brief Narrative: 33 year old with no significant past medical history who presents to the emergency department complaining of worsening right ear pain and drainage.  He was seen on 12/16/2018 in the ED and he was a started on amoxicillin and Ciprodex.  He follow-up with an ENT and was switched to azithromycin.  He presents with worsening pain, right side facial weakness.  He denies fever no cough.  He was evaluated by Dr. Jenne Pane ENT and recommended admission for IV antibiotics.   Assessment & Plan:   Active Problems:   Mastoiditis  1-Mastoiditis, right otitis media; -Failed oral antibiotics. -He was evaluated by ENT who recommend IV antibiotics. -Continue IV vancomycin and ceftriaxone. -Continue with Toradol as needed. -Culture, from ear ; no growth to date. -He reports pain today at 7 out of 10.  When he presented at admission his pain was 9 out of 10. Continue with antibiotics.  2-Right facial paralysis, mild.  To be cautious I will start antiviral. Will defer oral steroids to ENT.   Estimated body mass index is 21.36 kg/m as calculated from the following:   Height as of this encounter: 5' 10.5" (1.791 m).   Weight as of this encounter: 68.5 kg.   DVT prophylaxis: Lovenox Code Status: Full code Family Communication: Friend at bedside Disposition Plan: Remain in the hospital for IV antibiotics  Consultants:   ENT, Dr. Jenne Pane   Procedures:   None   Antimicrobials:   Vancomycin and ceftriaxone   Subjective: Patient reports pain 7 out of 10.  He first came in the ED his pain was 10 out of 10. He continued to have a small amount of drainage from his ear.    Objective: Vitals:   12/23/18 0518 12/23/18 1513 12/23/18 2152 12/24/18 0528  BP: 119/60 134/81 138/86 128/82  Pulse: (!) 57 75 61 78  Resp: 18 18 18 18   Temp: 98.2 F (36.8 C) 98.2 F (36.8 C) 98.1  F (36.7 C) 98 F (36.7 C)  TempSrc: Oral Oral Oral Oral  SpO2: 99% 99% 98% 100%  Weight:      Height:        Intake/Output Summary (Last 24 hours) at 12/24/2018 1611 Last data filed at 12/23/2018 1700 Gross per 24 hour  Intake 657.17 ml  Output -  Net 657.17 ml   Filed Weights   12/22/18 1128 12/22/18 1729  Weight: 68 kg 68.5 kg    Examination:  General exam: Not acute distress ENT; normal dentition, less tenderness in neck and around the ear Respiratory system: Clear to auscultation Cardiovascular system: S1, S2, regular rhythm and rate. Gastrointestinal system: Bowel sounds present, soft nontender nondistended Central nervous system: Alert and oriented.  Mild right-sided weakness Extremities: Metric power Skin:   Data Reviewed: I have personally reviewed following labs and imaging studies  CBC: Recent Labs  Lab 12/22/18 1254 12/22/18 1815 12/23/18 0336  WBC 11.2* 10.0 10.1  NEUTROABS 8.1*  --   --   HGB 17.2* 16.7 15.0  HCT 49.8 46.8 43.5  MCV 93.4 92.5 92.9  PLT 306 322 313   Basic Metabolic Panel: Recent Labs  Lab 12/22/18 1254 12/22/18 1815 12/23/18 0336 12/24/18 1024  NA 138  --  139 136  K 4.3  --  3.9 4.4  CL 102  --  105 105  CO2 29  --  27 22  GLUCOSE 113*  --  72 77  BUN 13  --  10 10  CREATININE 1.02 0.92 0.90 0.97  CALCIUM 9.7  --  8.8* 9.2   GFR: Estimated Creatinine Clearance: 107.9 mL/min (by C-G formula based on SCr of 0.97 mg/dL). Liver Function Tests: No results for input(s): AST, ALT, ALKPHOS, BILITOT, PROT, ALBUMIN in the last 168 hours. No results for input(s): LIPASE, AMYLASE in the last 168 hours. No results for input(s): AMMONIA in the last 168 hours. Coagulation Profile: No results for input(s): INR, PROTIME in the last 168 hours. Cardiac Enzymes: No results for input(s): CKTOTAL, CKMB, CKMBINDEX, TROPONINI in the last 168 hours. BNP (last 3 results) No results for input(s): PROBNP in the last 8760 hours. HbA1C: No  results for input(s): HGBA1C in the last 72 hours. CBG: No results for input(s): GLUCAP in the last 168 hours. Lipid Profile: No results for input(s): CHOL, HDL, LDLCALC, TRIG, CHOLHDL, LDLDIRECT in the last 72 hours. Thyroid Function Tests: No results for input(s): TSH, T4TOTAL, FREET4, T3FREE, THYROIDAB in the last 72 hours. Anemia Panel: No results for input(s): VITAMINB12, FOLATE, FERRITIN, TIBC, IRON, RETICCTPCT in the last 72 hours. Sepsis Labs: No results for input(s): PROCALCITON, LATICACIDVEN in the last 168 hours.  Recent Results (from the past 240 hour(s))  Ear culture     Status: None (Preliminary result)   Collection Time: 12/22/18  6:16 PM  Result Value Ref Range Status   Specimen Description EAR RIGHT  Final   Special Requests NONE  Final   Culture   Final    NO GROWTH Performed at [facility], [address], [place], [place] [address]    Report Status PENDING  Incomplete         Radiology Studies: No results found.      Scheduled Meds: . artificial tears   Right Eye QHS  . ciprofloxacin-dexamethasone  4 drop Right EAR BID  . enoxaparin (LOVENOX) injection  40 mg Subcutaneous Q24H  . polyvinyl alcohol  1 drop Right Eye Q1H while awake  . sodium chloride flush  3 mL Intravenous Q12H  . valACYclovir  1,000 mg Oral TID   Continuous Infusions: . sodium chloride Stopped (12/22/18 1629)  . sodium chloride    . sodium chloride 50 mL/hr at 12/23/18 1337  . cefTRIAXone (ROCEPHIN)  IV 1 g (12/23/18 1547)  . vancomycin 750 mg (12/24/18 1410)     LOS: 2 days    Time spent: 35 minutes.     Alba Cory, MD [facility] Pager [phone]  If 7PM-7AM, please contact night-coverage [email or web address] Password [email or web address] 12/24/2018, 4:11 PM

## 2018-12-24 NOTE — Plan of Care (Signed)
  Problem: Pain Managment: Goal: General experience of comfort will improve Outcome: Progressing   Problem: Safety: Goal: Ability to remain free from injury will improve Outcome: Progressing   Problem: Skin Integrity: Goal: Risk for impaired skin integrity will decrease Outcome: Progressing   

## 2018-12-25 LAB — EAR CULTURE: Culture: NO GROWTH

## 2018-12-25 LAB — BASIC METABOLIC PANEL
Anion gap: 10 (ref 5–15)
BUN: 14 mg/dL (ref 6–20)
CO2: 28 mmol/L (ref 22–32)
Calcium: 9.5 mg/dL (ref 8.9–10.3)
Chloride: 101 mmol/L (ref 98–111)
Creatinine, Ser: 0.97 mg/dL (ref 0.61–1.24)
GFR calc Af Amer: >60 mL/min (ref >60)
GFR calc non Af Amer: >60 mL/min (ref >60)
Glucose, Bld: 83 mg/dL (ref 70–99)
Potassium: 4 mmol/L (ref 3.5–5.1)
Sodium: 139 mmol/L (ref 135–145)

## 2018-12-25 MED ORDER — HYDROCODONE-ACETAMINOPHEN 5-325 MG PO TABS: 1.0000 | ORAL_TABLET | Freq: Four times a day (QID) | ORAL | 0 refills | Status: DC | PRN

## 2018-12-25 MED ORDER — VALACYCLOVIR HCL 1 G PO TABS: 1000.0000 mg | ORAL_TABLET | Freq: Three times a day (TID) | ORAL | 0 refills | Status: DC

## 2018-12-25 MED ORDER — CIPROFLOXACIN HCL 500 MG PO TABS: 500.0000 mg | ORAL_TABLET | Freq: Two times a day (BID) | ORAL | 0 refills | Status: AC

## 2018-12-25 MED ORDER — POLYVINYL ALCOHOL 1.4 % OP SOLN: 1.0000 [drp] | OPHTHALMIC | 0 refills | Status: AC

## 2018-12-25 MED ORDER — HYDROCODONE-ACETAMINOPHEN 5-325 MG PO TABS: 1.0000 | ORAL_TABLET | Freq: Four times a day (QID) | ORAL | 0 refills | Status: AC | PRN

## 2018-12-25 MED ORDER — CIPROFLOXACIN-DEXAMETHASONE 0.3-0.1 % OT SUSP: 4.0000 [drp] | Freq: Two times a day (BID) | OTIC | 0 refills | Status: AC

## 2018-12-25 MED ORDER — ARTIFICIAL TEARS OPHTHALMIC OINT: TOPICAL_OINTMENT | Freq: Every day | OPHTHALMIC | 0 refills | Status: AC

## 2018-12-25 MED ORDER — CLINDAMYCIN HCL 300 MG PO CAPS: 300.0000 mg | ORAL_CAPSULE | Freq: Three times a day (TID) | ORAL | 0 refills | Status: AC

## 2018-12-25 MED FILL — valACYclovir HCL 1 GM TABS: 1 | 4 days supply | Qty: 12 | Fill #0

## 2018-12-25 MED FILL — HYDROCODON-APAP 5-325: 5-325 | 3 days supply | Qty: 12 | Fill #0

## 2018-12-25 MED FILL — CIPROFLOXACIN HCL 500 MG TA: 500 | 7 days supply | Qty: 14 | Fill #0

## 2018-12-25 MED FILL — CLINDAMYCIN HCL 300 MG CAP: 300 | 7 days supply | Qty: 21 | Fill #0

## 2018-12-25 MED FILL — CIPRODEX OTIC SUSPENSION: 0.3-0.1 | 7 days supply | Qty: 8 | Fill #0

## 2018-12-25 NOTE — Progress Notes (Signed)
Pharmacy Antibiotic Note  Marcus Finley is a 33 y.o. male admitted on 12/22/2018 with mastoiditis and otitis medica on CT scan, non responsive to amoxicillin as outpatient.Subsequently developed Right acute otomastoiditis with TM perforation, otorrhea, and facial paresis  Plan: Vancomycin 750mg  IV every 8 hours.  CTX 1g IV q24h Ciprodex ear drops Cipro/Clinda on Dc either Tue/Wed; no need for levels  Height: 5' 10.5" (179.1 cm) Weight: 151 lb 0.2 oz (68.5 kg) IBW/kg (Calculated) : 74.15  Temp (24hrs), Avg:98 F (36.7 C), Min:97.7 F (36.5 C), Max:98.2 F (36.8 C)  Recent Labs  Lab 12/22/18 1254 12/22/18 1815 12/23/18 0336 12/24/18 1024 12/25/18 0309  WBC 11.2* 10.0 10.1  --   --   CREATININE 1.02 0.92 0.90 0.97 0.97    Estimated Creatinine Clearance: 107.9 mL/min (by C-G formula based on SCr of 0.97 mg/dL).    No Known Allergies  Isaac Bliss, PharmD, BCPS, BCCCP Clinical Pharmacist [phone]  Please check AMION for all [facility] Pharmacy numbers  12/25/2018 9:12 AM

## 2018-12-25 NOTE — Discharge Summary (Addendum)
Physician Discharge Summary  Marcus Finley YNW:[id number] DOB: 02/17/1986 DOA: 12/22/2018  PCP: Patient, No Pcp Per  Admit date: 12/22/2018 Discharge date: 12/25/2018  Admitted From: Home Disposition: Home  Recommendations for Outpatient Follow-up:  1. Follow up with PCP in 1-2 weeks 2. Please obtain BMP/CBC in one week 3. Needs to follow-up with Dr. Royetta CrochetPate for further care of otitis    Discharge Condition: Stable CODE STATUS: Full code Diet recommendation: Heart Healthy  Brief/Interim Summary:  33 year old with no significant past medical history who presents to the emergency department complaining of worsening right ear pain and drainage.  He was seen on 12/16/2018 in the ED and he was a started on amoxicillin and Ciprodex.  He follow-up with an ENT and was switched to azithromycin.  He presents with worsening pain, right side facial weakness.  He denies fever no cough.  He was evaluated by Dr. Jenne PaneBates ENT and recommended admission for IV antibiotics.  1-Mastoiditis, right otitis media; -Failed oral antibiotics. -He was evaluated by ENT who recommend IV antibiotics. -Continue IV vancomycin and ceftriaxone. He received 3 days of IV antibiotics.  -Continue with Toradol as needed. -Culture, from ear ; no growth to date. Pain has improved. He will be discharge this afternoon on cipro, and clindamycin for 1 week. Needs to follow up with DR Jenne PaneBates.  Per Dr Jenne PaneBates ok to discharge today.  I try to send the opioid prescription electronically.  I was not able to do so, I called IT and they were not able to tell me when I was going tobe help.  Outpatient pharmacy will receive the paper prescription.  2-Right facial paralysis, mild.   Started on Valtrex just in case. Facial paralysis secondary to mastoiditis and ear infection    Discharge Diagnoses:  Active Problems:   Mastoiditis    Discharge Instructions  Discharge Instructions    Diet - low sodium heart healthy   Complete by:  As  directed    Increase activity slowly   Complete by:  As directed      Allergies as of 12/25/2018   No Known Allergies     Medication List    TAKE these medications   artificial tears Oint ophthalmic ointment Commonly known as:  LACRILUBE Place into the right eye at bedtime.   ciprofloxacin 500 MG tablet Commonly known as:  Cipro Take 1 tablet (500 mg total) by mouth 2 (two) times daily for 7 days.   ciprofloxacin-dexamethasone OTIC suspension Commonly known as:  Ciprodex Place 4 drops into the right ear 2 (two) times daily for 7 days.   clindamycin 300 MG capsule Commonly known as:  CLEOCIN Take 1 capsule (300 mg total) by mouth 3 (three) times daily for 7 days.   HYDROcodone-acetaminophen 5-325 MG tablet Commonly known as:  NORCO/VICODIN Take 1 tablet by mouth every 6 (six) hours as needed for up to 3 days for moderate pain.   polyvinyl alcohol 1.4 % ophthalmic solution Commonly known as:  LIQUIFILM TEARS Place 1 drop into the right eye every hour while awake.   valACYclovir 1000 MG tablet Commonly known as:  VALTREX Take 1 tablet (1,000 mg total) by mouth 3 (three) times daily.       No Known Allergies  Consultations:  Dr. Jenne PaneBates   Procedures/Studies: Ct Temporal Bones W Contrast  Result Date: 12/22/2018 CLINICAL DATA:  Right ear pain.  Right facial weakness. EXAM: CT TEMPORAL BONES WITH CONTRAST TECHNIQUE: Axial and coronal plane CT imaging of the petrous temporal bones  was performed with thin-collimation image reconstruction after intravenous contrast administration. Multiplanar CT image reconstructions were also generated. CONTRAST:  105mL OMNIPAQUE IOHEXOL 300 MG/ML  SOLN COMPARISON:  None. FINDINGS: Left temporal bone: External auditory canal is widely patent. Tympanic membrane is normal. Middle ear is clear. Mastoid air cells are clear. Inner ear structures appear normal. Right temporal bone: Complete opacification of the middle ear. No ossicular chain  destruction identified. Near complete opacification throughout the mastoid air cells and the attic. No evidence of bone destruction or coalescence. Inner ear structures appear normal. No evidence of adjacent soft tissue abscess or of intracranial complication. Transverse and sigmoid sinuses show flow. Temporomandibular joints appear grossly unremarkable. IMPRESSION: Complete opacification of the middle ear on the right. Near complete opacification of the mastoid air cells and attic on the right. No evidence of bone destruction or coalescence. No evidence of adjacent soft tissue abscess or intracranial complication. Findings are consistent with otitis media and mastoiditis. Electronically Signed   By: Paulina Fusi M.D.   On: 12/22/2018 14:15      Subjective: He report improvement of ear pain. Today 5/10.    Discharge Exam: Vitals:   12/24/18 2259 12/25/18 0551  BP: 128/74 119/80  Pulse: 60 (!) 45  Resp: 18 17  Temp: 98.2 F (36.8 C) 97.7 F (36.5 C)  SpO2: 99% 99%     General: Pt is alert, awake, not in acute distress Cardiovascular: RRR, S1/S2 +, no rubs, no gallops Respiratory: CTA bilaterally, no wheezing, no rhonchi Abdominal: Soft, NT, ND, bowel sounds + Extremities: no edema, no cyanosis    The results of significant diagnostics from this hospitalization (including imaging, microbiology, ancillary and laboratory) are listed below for reference.     Microbiology: Recent Results (from the past 240 hour(s))  Ear culture     Status: None (Preliminary result)   Collection Time: 12/22/18  6:16 PM  Result Value Ref Range Status   Specimen Description EAR RIGHT  Final   Special Requests NONE  Final   Culture   Final    NO GROWTH Performed at [facility], [address], [place], [place] [address]    Report Status PENDING  Incomplete     Labs: BNP (last 3 results) No results for input(s): BNP in the last 8760 hours. Basic Metabolic Panel: Recent Labs  Lab  12/22/18 1254 12/22/18 1815 12/23/18 0336 12/24/18 1024 12/25/18 0309  NA 138  --  139 136 139  K 4.3  --  3.9 4.4 4.0  CL 102  --  105 105 101  CO2 29  --  27 22 28   GLUCOSE 113*  --  72 77 83  BUN 13  --  10 10 14   CREATININE 1.02 0.92 0.90 0.97 0.97  CALCIUM 9.7  --  8.8* 9.2 9.5   Liver Function Tests: No results for input(s): AST, ALT, ALKPHOS, BILITOT, PROT, ALBUMIN in the last 168 hours. No results for input(s): LIPASE, AMYLASE in the last 168 hours. No results for input(s): AMMONIA in the last 168 hours. CBC: Recent Labs  Lab 12/22/18 1254 12/22/18 1815 12/23/18 0336  WBC 11.2* 10.0 10.1  NEUTROABS 8.1*  --   --   HGB 17.2* 16.7 15.0  HCT 49.8 46.8 43.5  MCV 93.4 92.5 92.9  PLT 306 322 313   Cardiac Enzymes: No results for input(s): CKTOTAL, CKMB, CKMBINDEX, TROPONINI in the last 168 hours. BNP: Invalid input(s): POCBNP CBG: No results for input(s): GLUCAP in the  last 168 hours. D-Dimer No results for input(s): DDIMER in the last 72 hours. Hgb A1c No results for input(s): HGBA1C in the last 72 hours. Lipid Profile No results for input(s): CHOL, HDL, LDLCALC, TRIG, CHOLHDL, LDLDIRECT in the last 72 hours. Thyroid function studies No results for input(s): TSH, T4TOTAL, T3FREE, THYROIDAB in the last 72 hours.  Invalid input(s): FREET3 Anemia work up No results for input(s): VITAMINB12, FOLATE, FERRITIN, TIBC, IRON, RETICCTPCT in the last 72 hours. Urinalysis No results found for: COLORURINE, APPEARANCEUR, LABSPEC, PHURINE, GLUCOSEU, HGBUR, BILIRUBINUR, KETONESUR, PROTEINUR, UROBILINOGEN, NITRITE, LEUKOCYTESUR Sepsis Labs Invalid input(s): PROCALCITONIN,  WBC,  LACTICIDVEN Microbiology Recent Results (from the past 240 hour(s))  Ear culture     Status: None (Preliminary result)   Collection Time: 12/22/18  6:16 PM  Result Value Ref Range Status   Specimen Description EAR RIGHT  Final   Special Requests NONE  Final   Culture   Final    NO  GROWTH Performed at [facility], [address] [place], [place] [address]    Report Status PENDING  Incomplete     Time coordinating discharge: 40 minutes  SIGNED:   Alba Cory, MD  [facility]

## 2018-12-25 NOTE — TOC Initial Note (Signed)
Transition of Care [facility]) - Initial/Assessment Note    Patient Details  Name: Marcus Finley MRN: [id number] Date of Birth: 04-21-1986  Transition [facility] CM/SW Contact:    Kingsley Plan, RN Phone Number: 12/25/2018, 3:20 PM  Clinical Narrative:                 Patient from home with SGO . Patient unisured , wants to establish care at [facility]. Follow up appointment scheduled.    Patient entered in [facility] program and prescriptions sent to [facility] pharmacy.    Expected Discharge Plan: Home/Self Care Barriers to Discharge: No Barriers Identified   Patient Goals and CMS Choice   CMS Medicare.gov Compare Post Acute Care list provided to:: Patient Choice offered to / list presented to : NA  Expected Discharge Plan and Services Expected Discharge Plan: Home/Self Care Discharge Planning Services: CM Consult, Medication Assistance, MATCH Program Post Acute Care Choice: NA Living arrangements for the past 2 months: Single Family Home Expected Discharge Date: 12/25/18               DME Arranged: N/A DME Agency: NA HH Arranged: NA HH Agency: NA  Prior Living Arrangements/Services Living arrangements for the past 2 months: Single Family Home Lives with:: Domestic Partner Patient language and need for interpreter reviewed:: No Do you feel safe going back to the place where you live?: Yes      Need for Family Participation in Patient Care: No (Comment) Care giver support system in place?: Yes (comment)   Criminal Activity/Legal Involvement Pertinent to Current Situation/Hospitalization: No - Comment as needed  Activities of Daily Living Home Assistive Devices/Equipment: None ADL Screening (condition at time of admission) Patient's cognitive ability adequate to safely complete daily activities?: Yes Is the patient deaf or have difficulty hearing?: No Does the patient have difficulty seeing, even when wearing glasses/contacts?: No Does  the patient have difficulty concentrating, remembering, or making decisions?: No Patient able to express need for assistance with ADLs?: Yes Does the patient have difficulty dressing or bathing?: No Independently performs ADLs?: Yes (appropriate for developmental age) Does the patient have difficulty walking or climbing stairs?: No Weakness of Legs: None Weakness of Arms/Hands: None  Permission Sought/Granted                  Emotional Assessment Appearance:: Appears stated age Attitude/Demeanor/Rapport: Engaged Affect (typically observed): Accepting Orientation: : Oriented to Self, Oriented to Place, Oriented to  Time   Psych Involvement: No (comment)  Admission diagnosis:  Mastoiditis of right side [H70.91] Acute suppurative otitis media of right ear with spontaneous rupture of tympanic membrane, recurrence not specified [H66.011] Patient Active Problem List   Diagnosis Date Noted  . Mastoiditis 12/22/2018   PCP:  Patient, No Pcp Per Pharmacy:   [facility] [address] [place], [place] - [address] [place] [address] Phone: [phone] Fax: [phone]  [facility] [place], [place] - [address] [place] [address] Phone: [phone] Fax: [phone]     Social Determinants of Health (SDOH) Interventions    Readmission Risk Interventions 30 Day Unplanned Readmission Risk Score     ED to Hosp-Admission (Current) from 12/22/2018 in [facility]  SURGICAL  30 Day Unplanned Readmission Risk Score (%)  6 Filed at 12/25/2018 1200     This score is the patient's risk of  an unplanned readmission within 30 days of being discharged (0 -100%). The score is based on dignosis, age, lab data, medications, orders, and past utilization.   Low:  0-14.9   Medium: 15-21.9   High: 22-29.9   Extreme: 30 and above       No flowsheet data found.

## 2018-12-25 NOTE — Progress Notes (Signed)
   Subjective:    Patient ID: Marcus Finley, male    DOB: 07-02-1986, 33 y.o.   MRN: [id number]  HPI He can tell he is improving with decrease in pain, down from 7/10 yesterday to 5/10 today and some improvement in eye closing.  Less ear drainage also.  Review of Systems     Objective:   Physical Exam AF VSS Alert, NAD Right trace otorrhea Right facial weakness similar to yesterday    Assessment & Plan:  Right acute otomastoiditis with TM perforation, otorrhea, and facial weakness  Showing signs of improvement.  Culture still pending but no growth thus far.  Probably can be discharged today or tomorrow.  If culture does not help, would recommend treatment with clindamycin and ciprofloxacin along with Ciprodex drops.  Emphasize right eye moisturization.  He can follow-up in our office in one week.

## 2019-01-16 ENCOUNTER — Other Ambulatory Visit: Payer: Self-pay

## 2019-01-16 ENCOUNTER — Encounter: Payer: Self-pay | Admitting: Critical Care Medicine

## 2019-01-16 ENCOUNTER — Ambulatory Visit: Payer: Self-pay | Attending: Critical Care Medicine | Admitting: Critical Care Medicine

## 2019-01-16 DIAGNOSIS — H7091 Unspecified mastoiditis, right ear: Secondary | ICD-10-CM

## 2019-01-16 NOTE — Progress Notes (Signed)
Patient ID: Caseton Halaby, male   DOB: 1986/06/10, 33 y.o.   MRN: [id number] Virtual Visit via Telephone Note  I connected with Sandi Carne on 01/16/19 at  9:00 AM EDT by telephone and verified that I am speaking with the correct person using two identifiers.   I discussed the limitations, risks, security and privacy concerns of performing an evaluation and management service by telephone and the availability of in person appointments. I also discussed with the patient that there may be a patient responsible charge related to this service. The patient expressed understanding and agreed to proceed.   History of Present Illness: This is a telephone visit and no one else was on the line for this fit 33 year old male who was admitted March 14 to March 17 with severe right otitis media and mastoiditis.  His situation was so severe he required IV antibiotics.  ENT was involved in the care.  Below is excerpts from the patient's discharge summary. Admit date: 12/22/2018 Discharge date: 12/25/2018  Admitted From: Home Disposition: Home  Brief/Interim Summary: 33 year old with no significant past medical history who presents to the emergency department complaining of worsening right ear pain and drainage. He was seen on 12/16/2018 in the ED and he was a started on amoxicillin and Ciprodex. He follow-up with an ENT and was switched to azithromycin. He presents with worsening pain, right side facial weakness. He denies fever no cough. He was evaluated by Dr. Jenne Pane ENT and recommended admission for IV antibiotics.  1-Mastoiditis, right otitis media; -Failed oral antibiotics. -He was evaluated by ENT who recommend IV antibiotics. -Continue IV vancomycin and ceftriaxone. He received 3 days of IV antibiotics.  -Continue with Toradol as needed. -Culture, from ear;no growth to date. Pain has improved. He will be discharge this afternoon on cipro, and clindamycin for 1 week. Needs to follow up with DR  Jenne Pane.  Per Dr Jenne Pane ok to discharge today.  I try to send the opioid prescription electronically.  I was not able to do so, I called IT and they were not able to tell me when I was going tobe help.  Outpatient pharmacy will receive the paper prescription.  2-Right facial paralysis, mild.  Started on Valtrex just in case. Facial paralysis secondary to mastoiditis and ear infection  Overall the patient is improved.  There is no fever.  There is no chest pain or shortness of breath.  The patient's hearing is improved.  He had paralysis in the right face this is now resolved.  He has finished all his antibiotics.  He is not on any medications at whatsoever at this time.  He is under some financial stress and that he is not currently employed.   The patient has no new requests.   Pos in BOLD Constitutional:   No  weight loss, night sweats,  Fevers, chills, fatigue, lassitude. HEENT:   No headaches,  Difficulty swallowing,  Tooth/dental problems,  Sore throat,                No sneezing, itching, ear ache, nasal congestion, post nasal drip,   CV:  No chest pain,  Orthopnea, PND, swelling in lower extremities, anasarca, dizziness, palpitations  GI  No heartburn, indigestion, abdominal pain, nausea, vomiting, diarrhea, change in bowel habits, loss of appetite  Resp: No shortness of breath with exertion or at rest.  No excess mucus, no productive cough,  No non-productive cough,  No coughing up of blood.  No change in color of mucus.  No wheezing.  No chest wall deformity  Skin: no rash or lesions.  GU: no dysuria, change in color of urine, no urgency or frequency.  No flank pain.  MS:  No joint pain or swelling.  No decreased range of motion.  No back pain.  Psych:  No change in mood or affect. No depression or anxiety.  No memory loss.   Observations/Objective: This was a telephone visit so there are no observations  Assessment and Plan: #1 severe mastoiditis and associated  otitis media on the right.  Markedly improved now with antibiotics.  No additional antibiotics are indicated.  The patient will follow back up with us with regards to his ear on an as-needed basis  #2 need to establish for primary care: We will establish this patient in the next 2 to 3 months in our clinic for primary care.  He does need a tetanus vaccine and HIV screen.  Follow Up Instructions: The patient understands there is no need for additional medications, I asked him to focus on smoking cessation, and he will have a primary care visit in 2 to 3 months at this clinic   I discussed the assessment and treatment plan with the patient. The patient was provided an opportunity to ask questions and all were answered. The patient agreed with the plan and demonstrated an understanding of the instructions.   The patient was advised to call back or seek an in-person evaluation if the symptoms worsen or if the condition fails to improve as anticipated.  I provided 15 minutes of non-face-to-face time during this encounter.   Shan LevansPatrick , MD

## 2019-01-16 NOTE — Progress Notes (Signed)
Patient would like to talk about finance   Hospital follow up for Mastoiditis  No Chief complaint

## 2019-05-22 IMAGING — CT CT TEMPORAL BONES WITH CONTRAST
2 of 6 series · 13 of 40 positions shown, 16 images · IV contrast (omnipaque)
Comparison: None.

CLINICAL DATA: Right ear pain.  Right facial weakness.

EXAM:
CT TEMPORAL BONES WITH CONTRAST
TECHNIQUE: Axial and coronal plane CT imaging of the petrous temporal bones was
performed with thin-collimation image reconstruction after
intravenous contrast administration. Multiplanar CT image
reconstructions were also generated.
CONTRAST:  80mL OMNIPAQUE IOHEXOL 300 MG/ML  SOLN

[Series 7: ax mag left · axial · 0.19mm/px · z∈[+1009,+1057]mm · 11 of 98 slices shown, 14 images]
[im 9/98  brain]
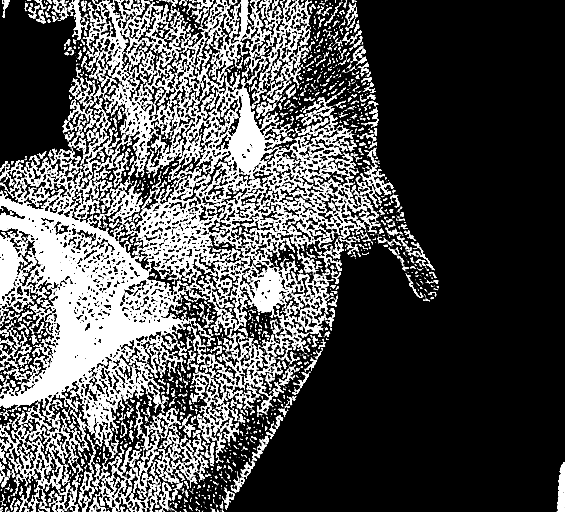
[im 9/98  bone]
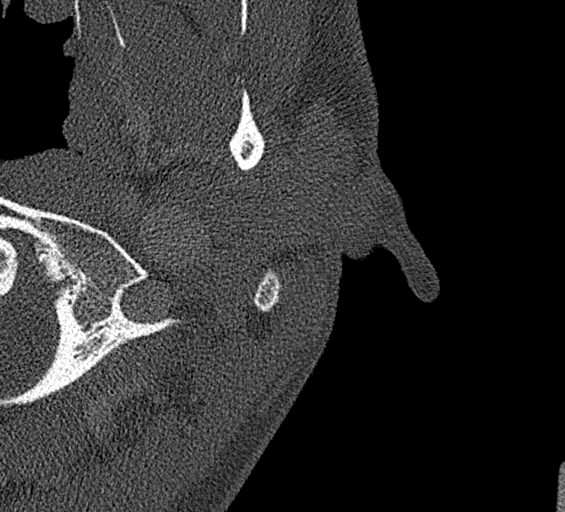
[im 17/98  bone]
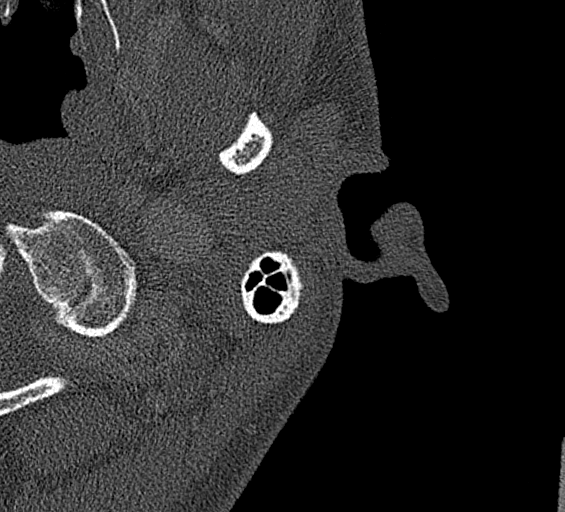
[im 25/98  bone]
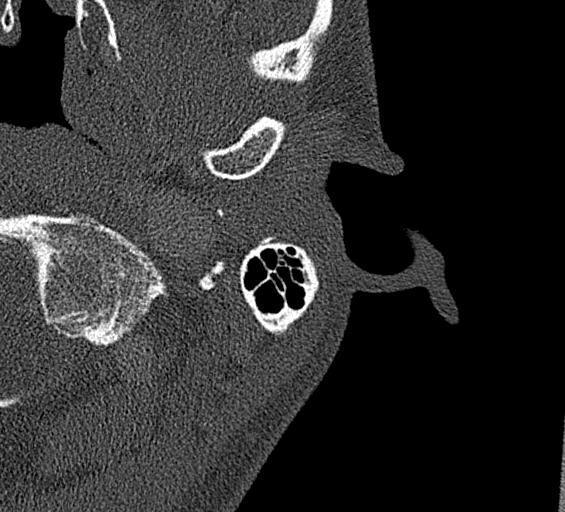
[im 33/98  bone]
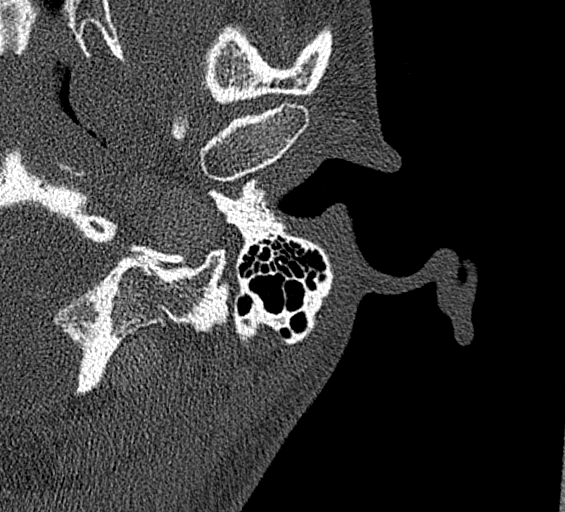
[im 41/98  brain]
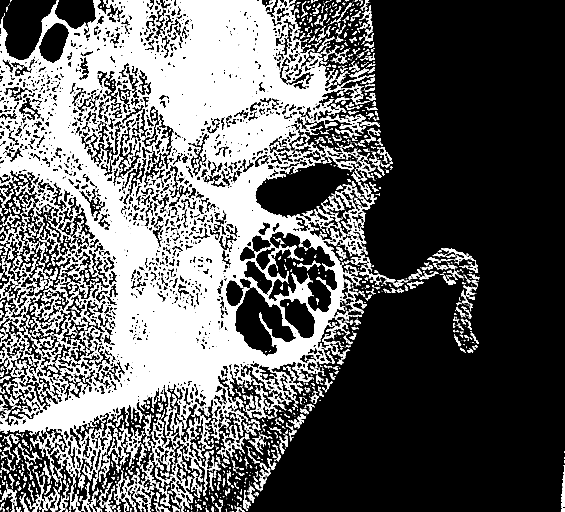
[im 41/98  bone]
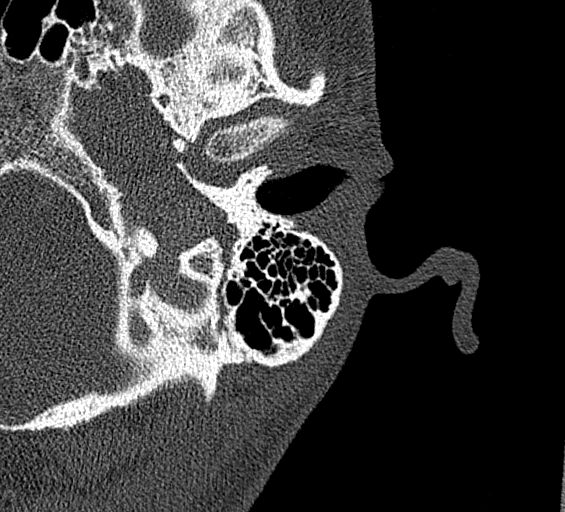
[im 49/98  bone]
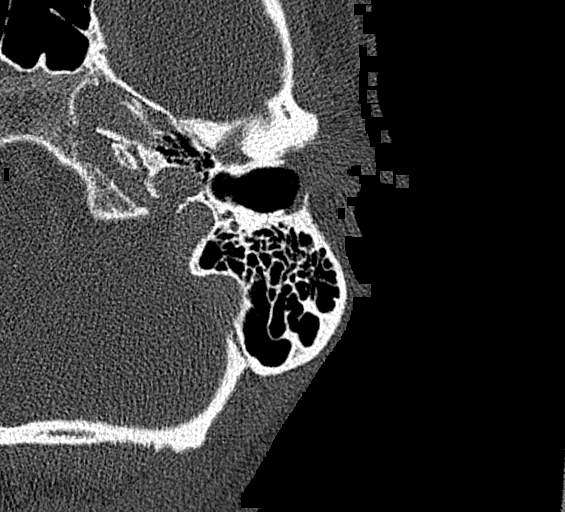
[im 57/98  bone]
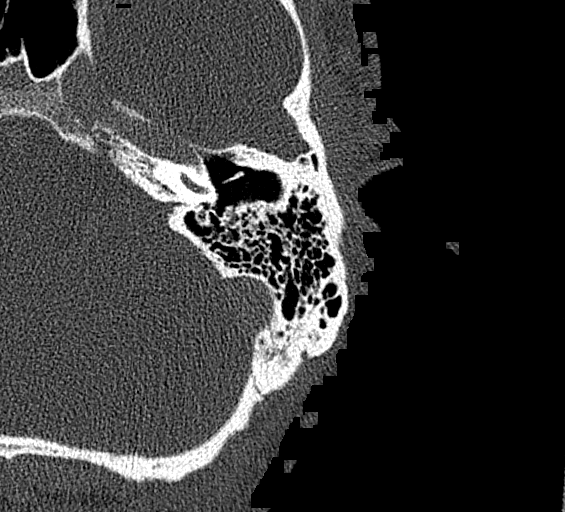
[im 65/98  bone]
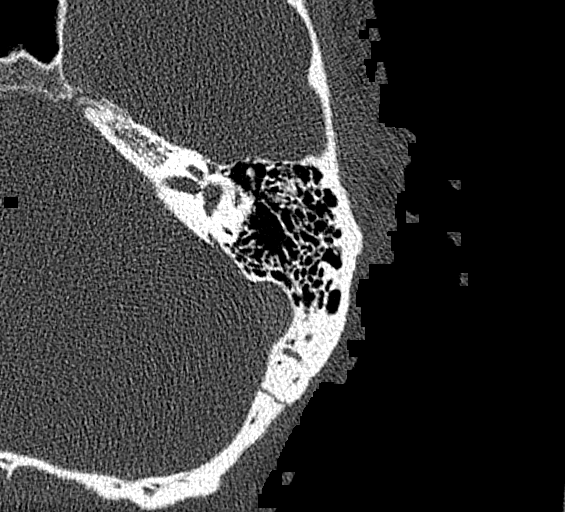
[im 73/98  brain]
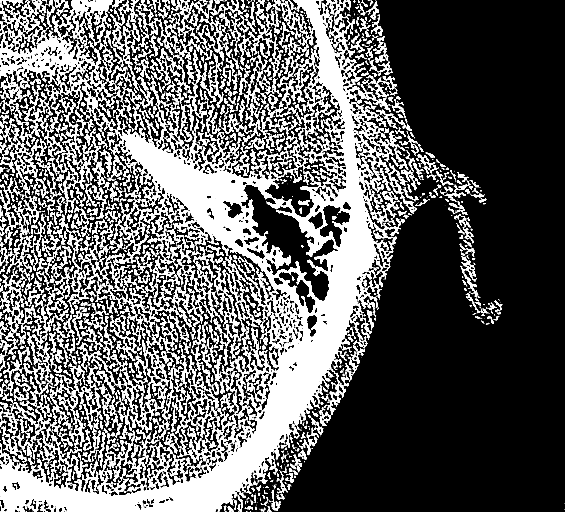
[im 73/98  bone]
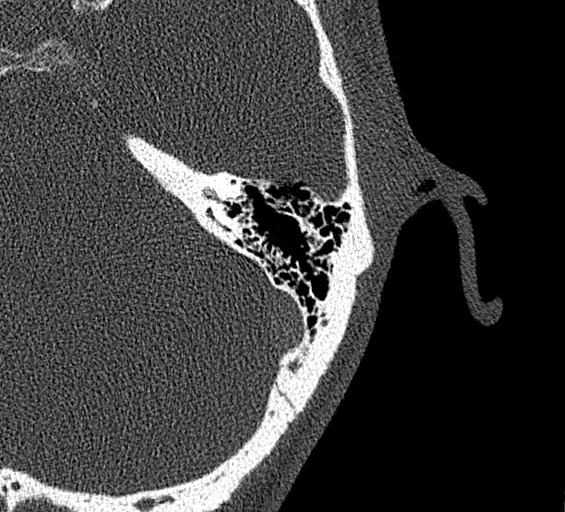
[im 81/98  bone]
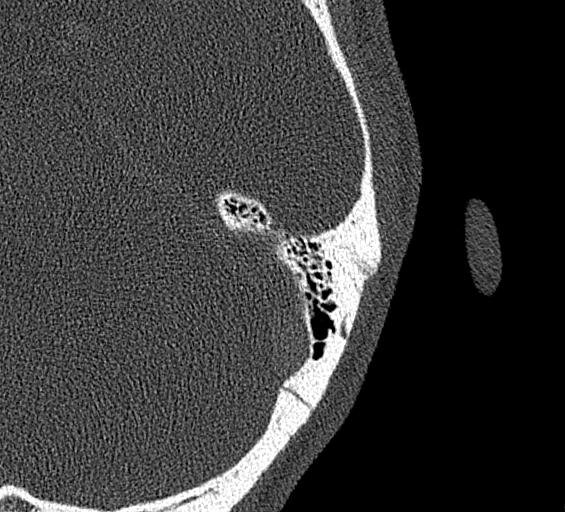
[im 89/98  bone]
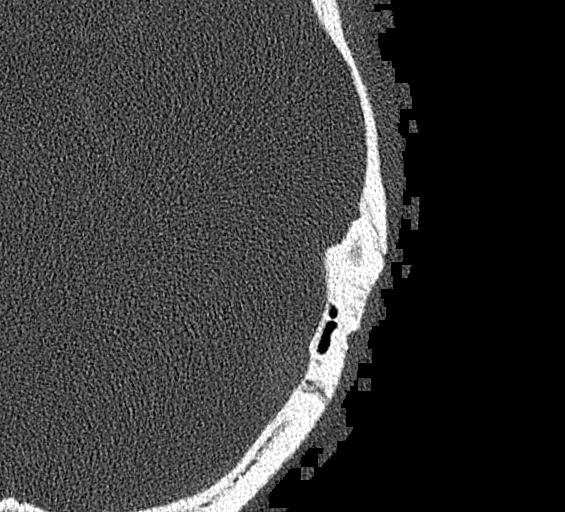

[Series 8: cor mag left · coronal · 0.12mm/px · 2 of 108 slices shown]
[im 36/108  bone]
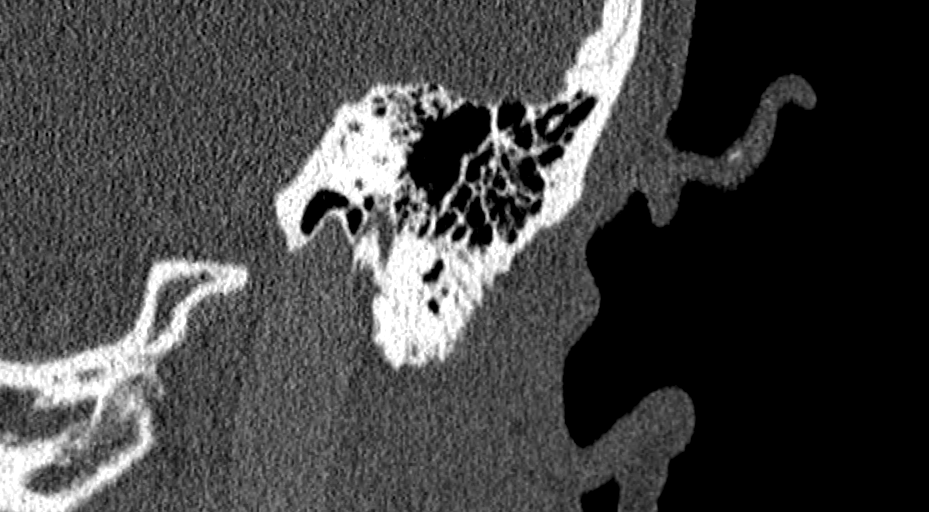
[im 72/108  bone]
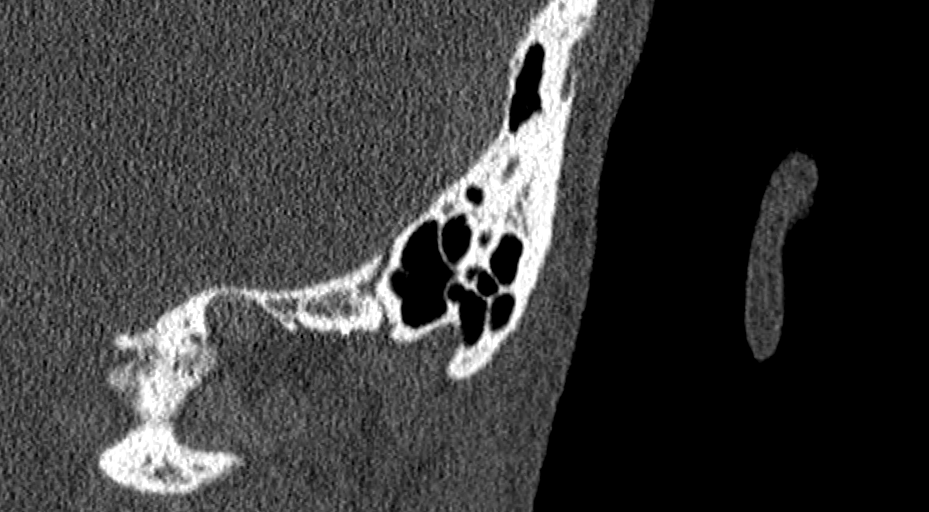

[13 of 40 positions shown; findings below may reference images not displayed]

FINDINGS: Left temporal bone: External auditory canal is widely patent.
Tympanic membrane is normal. Middle ear is clear. Mastoid air cells
are clear. Inner ear structures appear normal.

Right temporal bone: Complete opacification of the middle ear. No
ossicular chain destruction identified. Near complete opacification
throughout the mastoid air cells and the attic. No evidence of bone
destruction or coalescence. Inner ear structures appear normal. No
evidence of adjacent soft tissue abscess or of intracranial
complication. Transverse and sigmoid sinuses show flow.
Temporomandibular joints appear grossly unremarkable.
IMPRESSION: Complete opacification of the middle ear on the right. Near complete
opacification of the mastoid air cells and attic on the right. No
evidence of bone destruction or coalescence. No evidence of adjacent
soft tissue abscess or intracranial complication. Findings are
consistent with otitis media and mastoiditis.
